# Patient Record
Sex: Female | Born: 1965 | Race: White | Hispanic: No | Marital: Single | Smoking: Former smoker
Health system: Southern US, Community
[De-identification: ages and names within clinical notes are randomized; demographics above are authoritative.]

## PROBLEM LIST (undated history)

## (undated) DIAGNOSIS — E119 Type 2 diabetes mellitus without complications: Secondary | ICD-10-CM

## (undated) HISTORY — DX: Type 2 diabetes mellitus without complications: E11.9

## (undated) HISTORY — PX: ABDOMINAL HYSTERECTOMY: SHX81

---

## 2006-03-12 ENCOUNTER — Ambulatory Visit: Payer: Self-pay | Admitting: Obstetrics and Gynecology

## 2008-04-13 ENCOUNTER — Ambulatory Visit: Payer: Self-pay | Admitting: Obstetrics and Gynecology

## 2009-04-15 ENCOUNTER — Ambulatory Visit: Payer: Self-pay | Admitting: Obstetrics and Gynecology

## 2010-04-17 ENCOUNTER — Ambulatory Visit: Payer: Self-pay | Admitting: Obstetrics and Gynecology

## 2011-06-01 ENCOUNTER — Ambulatory Visit: Payer: Self-pay | Admitting: Obstetrics and Gynecology

## 2012-06-04 ENCOUNTER — Ambulatory Visit: Payer: Self-pay | Admitting: Obstetrics and Gynecology

## 2013-06-05 ENCOUNTER — Ambulatory Visit: Payer: Self-pay | Admitting: Obstetrics and Gynecology

## 2014-01-13 DIAGNOSIS — G4700 Insomnia, unspecified: Secondary | ICD-10-CM | POA: Insufficient documentation

## 2014-01-13 DIAGNOSIS — Z8742 Personal history of other diseases of the female genital tract: Secondary | ICD-10-CM | POA: Insufficient documentation

## 2014-06-25 ENCOUNTER — Ambulatory Visit: Payer: Self-pay | Admitting: Obstetrics and Gynecology

## 2014-09-09 ENCOUNTER — Ambulatory Visit: Payer: Self-pay | Admitting: Obstetrics and Gynecology

## 2014-09-09 LAB — BASIC METABOLIC PANEL
Anion Gap: 9 (ref 7–16)
BUN: 7 mg/dL (ref 7–18)
CHLORIDE: 106 mmol/L (ref 98–107)
CO2: 25 mmol/L (ref 21–32)
Calcium, Total: 8.1 mg/dL — ABNORMAL LOW (ref 8.5–10.1)
Creatinine: 0.86 mg/dL (ref 0.60–1.30)
EGFR (African American): >60
Glucose: 172 mg/dL — ABNORMAL HIGH (ref 65–99)
OSMOLALITY: 281 (ref 275–301)
POTASSIUM: 4 mmol/L (ref 3.5–5.1)
SODIUM: 140 mmol/L (ref 136–145)

## 2014-09-09 LAB — HEMOGLOBIN: HGB: 9.6 g/dL — ABNORMAL LOW (ref 12.0–16.0)

## 2014-09-17 ENCOUNTER — Ambulatory Visit: Payer: Self-pay | Admitting: Obstetrics and Gynecology

## 2014-09-18 LAB — BASIC METABOLIC PANEL
ANION GAP: 10 (ref 7–16)
BUN: 7 mg/dL (ref 7–18)
CO2: 25 mmol/L (ref 21–32)
CREATININE: 0.99 mg/dL (ref 0.60–1.30)
Calcium, Total: 7.4 mg/dL — ABNORMAL LOW (ref 8.5–10.1)
Chloride: 103 mmol/L (ref 98–107)
EGFR (African American): >60
Glucose: 91 mg/dL (ref 65–99)
Osmolality: 273 (ref 275–301)
Potassium: 3.9 mmol/L (ref 3.5–5.1)
Sodium: 138 mmol/L (ref 136–145)

## 2014-09-18 LAB — HEMATOCRIT: HCT: 32.2 % — AB (ref 35.0–47.0)

## 2015-02-26 NOTE — Op Note (Signed)
PATIENT NAME:  Tiffany MainsRAY, Zoria A MR#:  [id number] DATE OF BIRTH:  [birth year]  DATE OF PROCEDURE:  09/17/2014  PREOPERATIVE DIAGNOSES:   1.  Persistent cervical dysplasia.   2.  Abnormal uterine bleeding.    POSTOPERATIVE DIAGNOSES:   1.  Persistent cervical dysplasia.   2.  Abnormal uterine bleeding.   3.  Uterine fibroids.    PROCEDURES PERFORMED:   1.  Transvaginal hysterectomy.   2.  Bilateral salpingectomy.    SURGEON:  Jolinda Croakavid Goodman, MD.    ASSISTANT:  Christeen DouglasBethany Beasley, MD.    ANESTHESIA:  General endotracheal anesthesia.    INTRAVENOUS FLUIDS:  1300 mL lactated Ringer.    ESTIMATED BLOOD LOSS:  50 mL.    URINE OUTPUT:  200 mL clear throughout the course of the case.    COMPLICATIONS:  None.    INDICATIONS:  The patient had persistent low grade dysplasia on multiple Pap smears and colposcopy showed only low grade on multiple evaluations over the last several years.  She was given the option of IUD to address her abnormal uterine bleeding and LEEP procedure to help reduce her risk of cervical cancer, versus having a hysterectomy and she elected to have hysterectomy.    FINDINGS:  Overall benign-appearing cervix and uterus. Approximately 8 cm uterus with three 2 cm fibroids at the fundus.    PROCEDURE:  After obtaining appropriate consent the patient was taken to the operating room where she was placed under general anesthesia. She was prepped and draped in the normal sterile fashion in the dorsal supine position in candy cane stirrups.  A timeout was taken to insure the correct patient and the correct procedure, that her allergies were identified, and that prophylactic antibiotics had been given.  Ancef 2 grams were given at 7:28.   A Foley catheter was inserted and the bladder was drained.  Next a weighed speculum was used along with a small Deaver to visualize the cervix.  The cervix was grasped with a Christella HartiganJacobs tenaculum.  The cervicovaginal junction was infiltrated using 20 mL of  dilute vasopressin (20 units in 30 mL). Next the cervicovaginal junction was circumscribed using Bovie electrosurgery.  Next the posterior aspect of the uterus was palpated and the posterior peritoneum was entered sharply using scissors.  The Heaney clamp was used for all pedicles.  0 Vicryl was used unless otherwise stated.  Larger pedicles were transfixed.  The procedure was performed exactly the same on the left and the right.  The uterosacral ligament was then identified and transected.  This was clamped, cut, tied, and held with a stay suture.  Next a long weighted speculum was placed and adequate visualization of the posterior cul-de-sac was obtained.  The attention was then turned to the anterior portion of the procedure.  The cervicovaginal junction was further dissected sharply using Metzenbaum scissors.  The clear peritoneal reflection could not be identified, so another bite was taken posteriorly.  The cardinal ligament was then clamped, cut, and tied and adequate hemostasis was noted.  Next attention was turned anteriorly again and sharp dissection yielded sufficient view of the peritoneal reflection.  Anterior peritoneum was entered sharply and the large Deaver retractor was placed atraumatically.  This was confirmed to be anterior peritoneum visually.  Next the uterine artery was clamped, cut, and tied and adequate hemostasis was noted.  The broad ligament was then further dissected with approximate 2 clamp, cut, tie sequences.  At this point it was noted that there was a significant  volume in the fundus.  The uterus was morcellated vaginally using a scalpel.  This was done in a coring fashion with all specimens directly removed under dissection and traction without any evidence of specimen being released into the patient's peritoneal cavity.  There were 3 approximately 2 cm fibroids identified at the uterus that were removed and saved for pathology.  Next the uteroovarians could be identified.  These  were clamped, cut, and tied in traditional fashion.  They were doubly ligated first with a transfixing suture and then with a free tie.  The uterus and cervix were then removed and adequate hemostasis was noted.  The fallopian tubes were then visually identified, crossed with Heaney clamp, excised, and tied with 0-Vicryl.  Next long wet tape sponge was placed for adequate visualization.  There was 1 area on the right inferior aspect between the uterosacral and ovarian pedicle that was made hemostatic using a figure-of-eight suture.    The anterior peritoneum was then closed using 2-0 Vicryl in a pursestring fashion.  A McCall uterosacral fixation was completed using 0 Vicryl.  This was accomplished with the needle passing through the posterior vaginal cuff and then posterior peritoneum including the left uterosacral ligament, posterior peritoneum, and then right uterosacral ligament and then exiting the posterior peritoneum and vaginal cuff.  The McCall suture was tied down and adequate apical support was noted.  The vaginal cuff was then closed using 4 figure-of-eight sutures of 0 Vicryl.  Excellent hemostasis was noted, but there had been significant bleeding from the posterior cuff so a vaginal packing was placed prophylactically.  All instruments and sponges were removed from the patient.  Sponge, lap and needle counts were correct x 2.  The patient was awoken from anesthesia without complication.  She was transferred to the floor in stable condition.     ____________________________ Teena Irani. Derrill Kay, MD :bu D: 09/17/2014 17:38:53 ET T: 09/17/2014 21:00:21 ET JOB#: [id number]  cc: Teena Irani. Derrill Kay, MD, <Dictator> DAVID Farrel Conners MD ELECTRONICALLY SIGNED 09/23/2014 7:13

## 2015-02-28 LAB — SURGICAL PATHOLOGY

## 2015-06-10 ENCOUNTER — Other Ambulatory Visit: Payer: Self-pay | Admitting: Obstetrics and Gynecology

## 2015-06-10 DIAGNOSIS — Z1231 Encounter for screening mammogram for malignant neoplasm of breast: Secondary | ICD-10-CM

## 2015-06-28 ENCOUNTER — Ambulatory Visit
Admission: RE | Admit: 2015-06-28 | Discharge: 2015-06-28 | Disposition: A | Discharge status: Home / Self Care | Payer: 59 | Source: Ambulatory Visit | Attending: Obstetrics and Gynecology | Admitting: Obstetrics and Gynecology

## 2015-06-28 DIAGNOSIS — Z1231 Encounter for screening mammogram for malignant neoplasm of breast: Secondary | ICD-10-CM | POA: Insufficient documentation

## 2017-05-03 ENCOUNTER — Other Ambulatory Visit: Payer: Self-pay | Admitting: Student

## 2017-05-03 DIAGNOSIS — Z1239 Encounter for other screening for malignant neoplasm of breast: Secondary | ICD-10-CM

## 2018-05-21 ENCOUNTER — Other Ambulatory Visit: Payer: Self-pay | Admitting: Student

## 2018-05-21 DIAGNOSIS — Z1231 Encounter for screening mammogram for malignant neoplasm of breast: Secondary | ICD-10-CM

## 2018-10-20 ENCOUNTER — Ambulatory Visit
Admission: RE | Admit: 2018-10-20 | Discharge: 2018-10-20 | Disposition: A | Discharge status: Home / Self Care | Payer: BLUE CROSS/BLUE SHIELD | Source: Ambulatory Visit | Attending: Student | Admitting: Student

## 2018-10-20 DIAGNOSIS — Z1231 Encounter for screening mammogram for malignant neoplasm of breast: Secondary | ICD-10-CM | POA: Insufficient documentation

## 2019-06-09 DIAGNOSIS — F411 Generalized anxiety disorder: Secondary | ICD-10-CM | POA: Insufficient documentation

## 2019-06-09 DIAGNOSIS — E1169 Type 2 diabetes mellitus with other specified complication: Secondary | ICD-10-CM | POA: Insufficient documentation

## 2019-11-20 ENCOUNTER — Other Ambulatory Visit: Payer: Self-pay | Admitting: Student

## 2019-11-20 DIAGNOSIS — Z1231 Encounter for screening mammogram for malignant neoplasm of breast: Secondary | ICD-10-CM

## 2019-11-23 ENCOUNTER — Ambulatory Visit
Admission: RE | Admit: 2019-11-23 | Discharge: 2019-11-23 | Disposition: A | Discharge status: Home / Self Care | Payer: BC Managed Care – PPO | Source: Ambulatory Visit | Attending: Student | Admitting: Student

## 2019-11-23 DIAGNOSIS — Z1231 Encounter for screening mammogram for malignant neoplasm of breast: Secondary | ICD-10-CM | POA: Insufficient documentation

## 2021-03-14 ENCOUNTER — Other Ambulatory Visit: Payer: Self-pay | Admitting: Student

## 2021-03-14 DIAGNOSIS — Z1231 Encounter for screening mammogram for malignant neoplasm of breast: Secondary | ICD-10-CM

## 2021-03-16 ENCOUNTER — Ambulatory Visit
Admission: RE | Admit: 2021-03-16 | Discharge: 2021-03-16 | Disposition: A | Discharge status: Home / Self Care | Payer: BC Managed Care – PPO | Source: Ambulatory Visit | Attending: Student | Admitting: Student

## 2021-03-16 ENCOUNTER — Other Ambulatory Visit: Payer: Self-pay

## 2021-03-16 DIAGNOSIS — Z1231 Encounter for screening mammogram for malignant neoplasm of breast: Secondary | ICD-10-CM | POA: Diagnosis not present

## 2021-07-04 DIAGNOSIS — M545 Low back pain, unspecified: Secondary | ICD-10-CM | POA: Insufficient documentation

## 2021-10-07 IMAGING — MG MM DIGITAL SCREENING BILAT W/ TOMO AND CAD
8 series · 9 of 24 positions shown · non-contrast
Comparison: Previous exam(s).

CLINICAL DATA: Screening.

EXAM:
DIGITAL SCREENING BILATERAL MAMMOGRAM WITH TOMOSYNTHESIS AND CAD
TECHNIQUE: Bilateral screening digital craniocaudal and mediolateral oblique
mammograms were obtained. Bilateral screening digital breast
tomosynthesis was performed. The images were evaluated with
computer-aided detection.

[L MLO synth-2D]
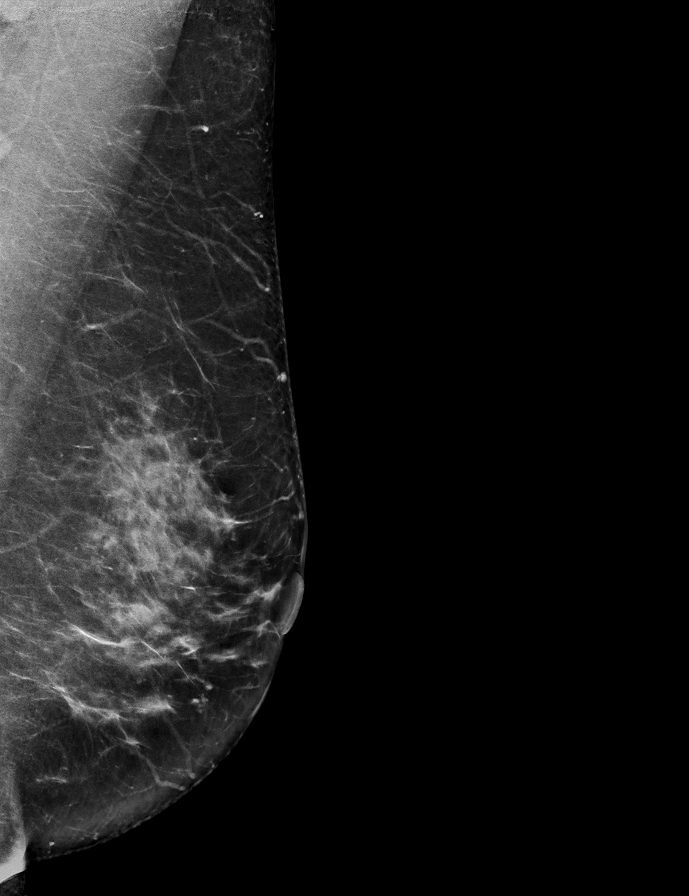

[L CC synth-2D]
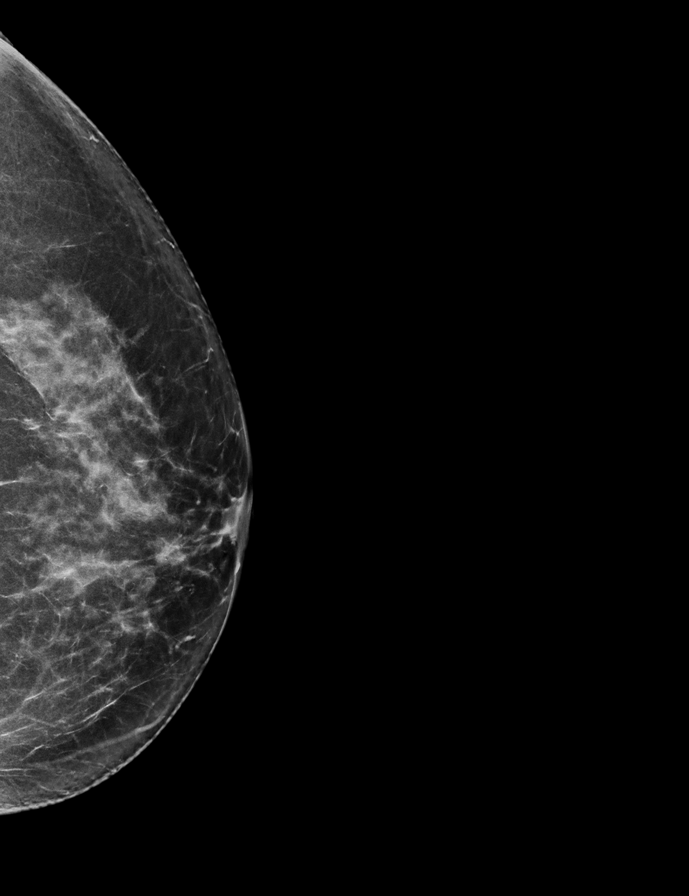

[R CC synth-2D]
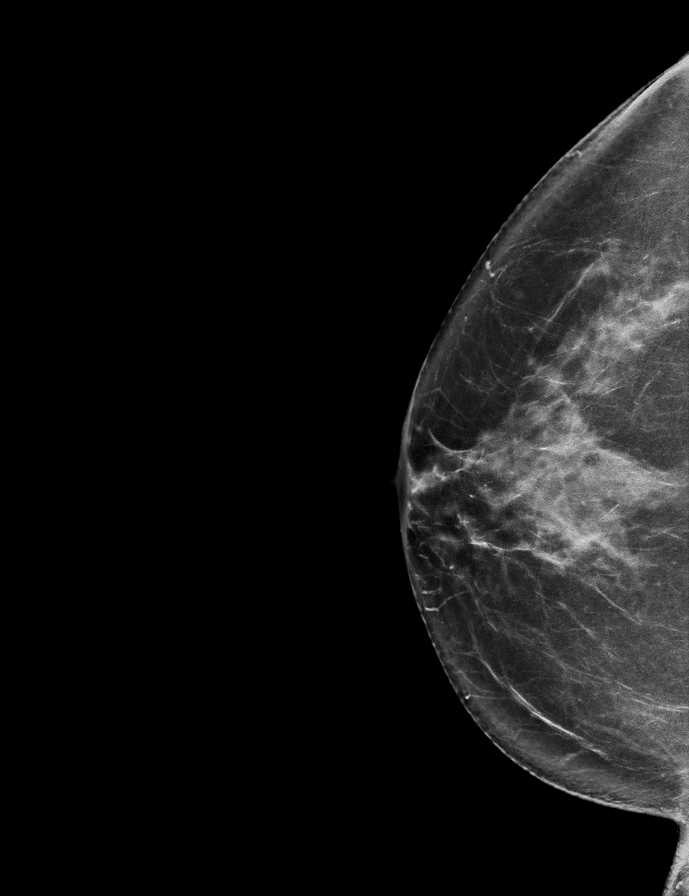

[R MLO synth-2D]
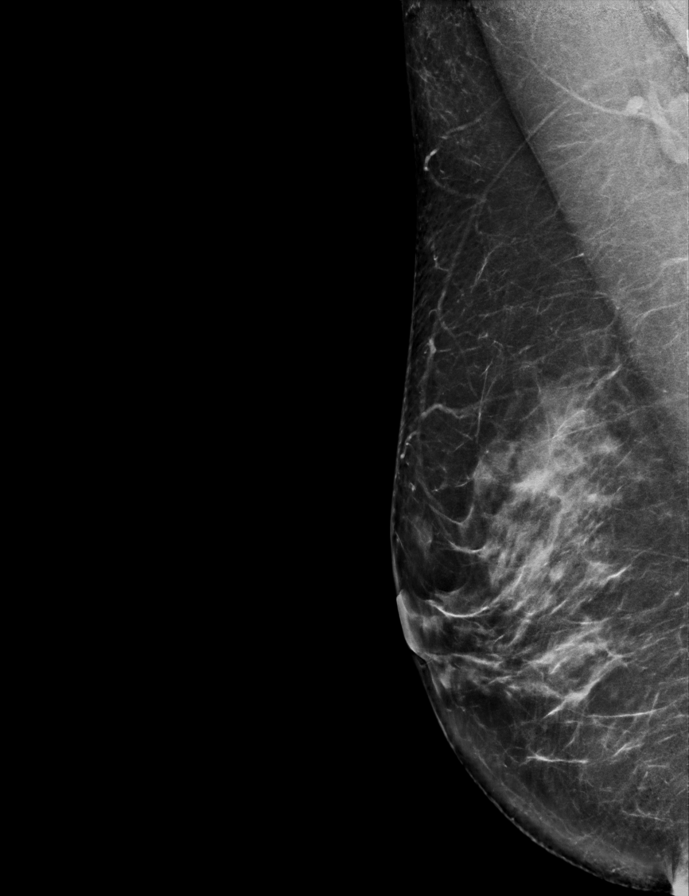

[L MLO tomo · 2 of 75 frames shown]
[frame 25/75]
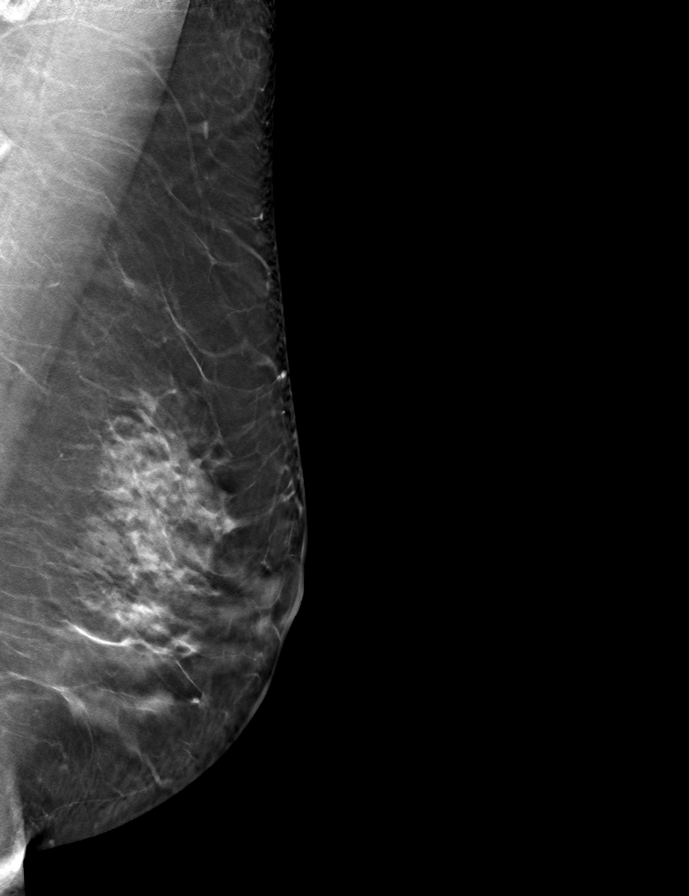
[frame 38/75]
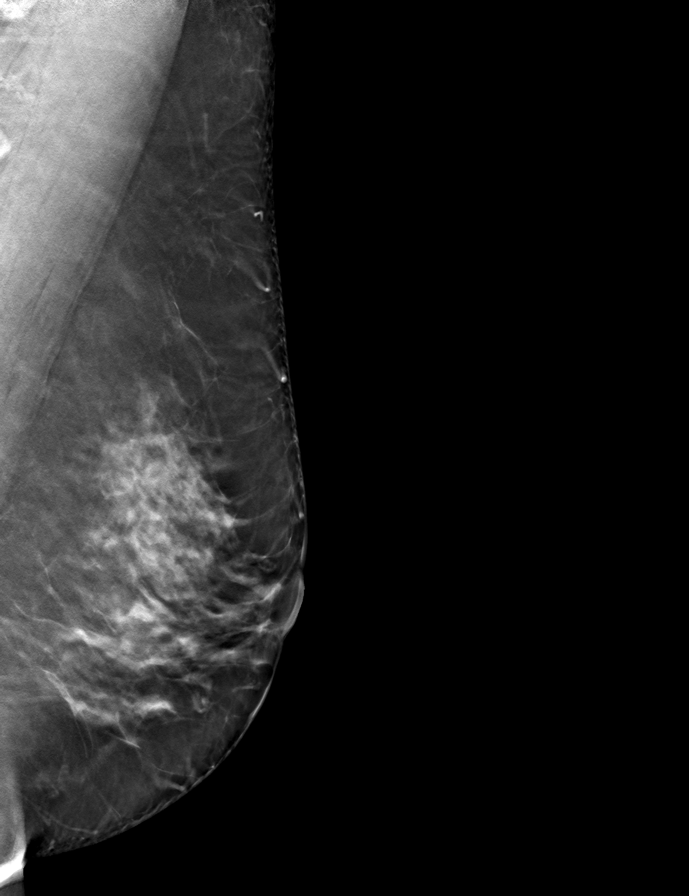

[R CC tomo · tomo slice 39/76.0]
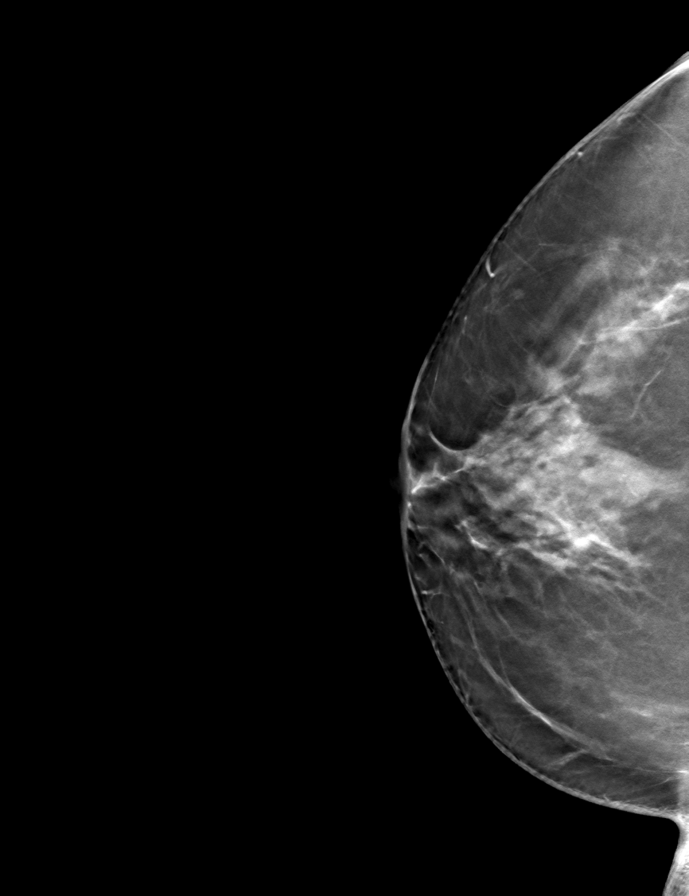

[R MLO tomo · tomo slice 40/79.0]
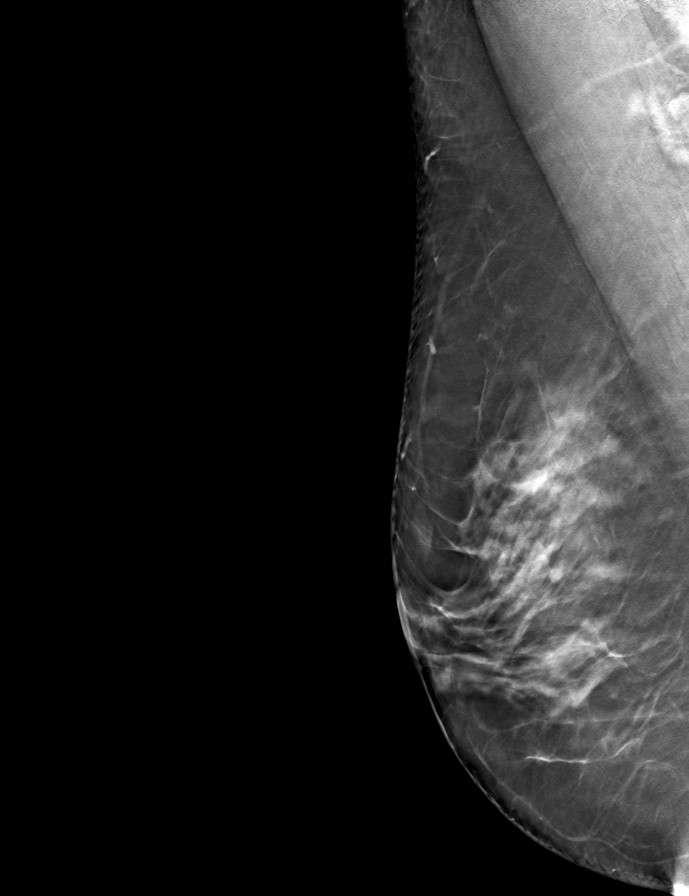

[L CC tomo · tomo slice 33/66.0]
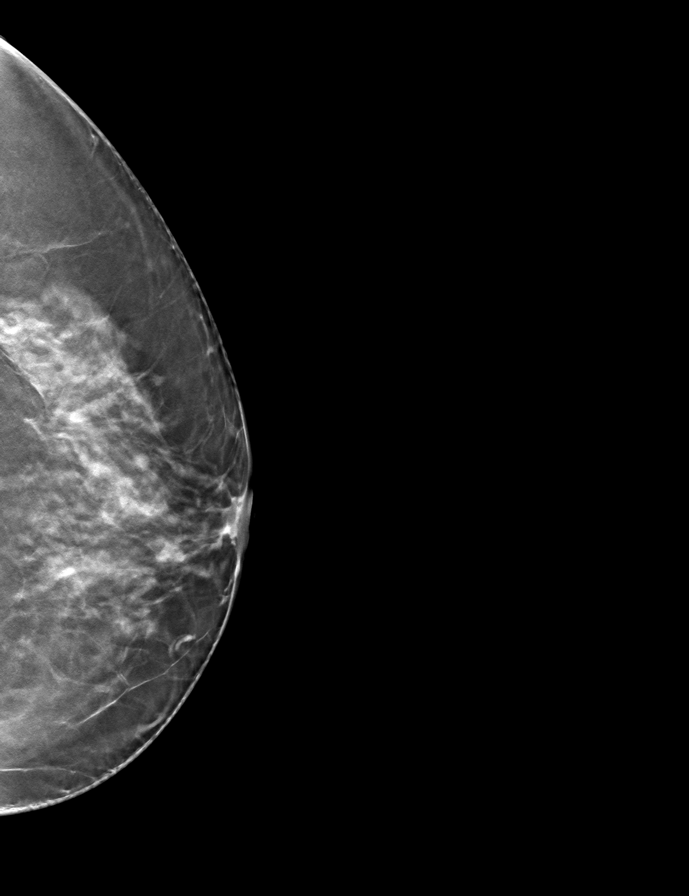

[9 of 24 positions shown; findings below may reference images not displayed]

ACR Breast Density Category c: The breast tissue is heterogeneously
dense, which may obscure small masses.
FINDINGS: There are no findings suspicious for malignancy. The images were
evaluated with computer-aided detection.
IMPRESSION: No mammographic evidence of malignancy. A result letter of this
screening mammogram will be mailed directly to the patient.

RECOMMENDATION:
Screening mammogram in one year. (Code:T4-5-GWO)

BI-RADS CATEGORY  1: Negative.

## 2022-05-02 ENCOUNTER — Other Ambulatory Visit: Payer: Self-pay | Admitting: Student

## 2022-05-02 DIAGNOSIS — Z1231 Encounter for screening mammogram for malignant neoplasm of breast: Secondary | ICD-10-CM

## 2022-05-28 ENCOUNTER — Ambulatory Visit
Admission: RE | Admit: 2022-05-28 | Discharge: 2022-05-28 | Disposition: A | Discharge status: Home / Self Care | Payer: BC Managed Care – PPO | Source: Ambulatory Visit | Attending: Student | Admitting: Student

## 2022-05-28 DIAGNOSIS — Z1231 Encounter for screening mammogram for malignant neoplasm of breast: Secondary | ICD-10-CM

## 2022-06-28 ENCOUNTER — Emergency Department
Admission: EM | Admit: 2022-06-28 | Discharge: 2022-06-28 | Disposition: A | Discharge status: Home / Self Care | Payer: BC Managed Care – PPO | Attending: Emergency Medicine | Admitting: Emergency Medicine

## 2022-06-28 ENCOUNTER — Other Ambulatory Visit: Payer: Self-pay

## 2022-06-28 ENCOUNTER — Emergency Department: Payer: BC Managed Care – PPO

## 2022-06-28 DIAGNOSIS — S62308A Unspecified fracture of other metacarpal bone, initial encounter for closed fracture: Secondary | ICD-10-CM

## 2022-06-28 DIAGNOSIS — S4991XA Unspecified injury of right shoulder and upper arm, initial encounter: Secondary | ICD-10-CM | POA: Diagnosis present

## 2022-06-28 DIAGNOSIS — W1839XA Other fall on same level, initial encounter: Secondary | ICD-10-CM | POA: Diagnosis not present

## 2022-06-28 DIAGNOSIS — W19XXXA Unspecified fall, initial encounter: Secondary | ICD-10-CM

## 2022-06-28 DIAGNOSIS — S62304A Unspecified fracture of fourth metacarpal bone, right hand, initial encounter for closed fracture: Secondary | ICD-10-CM | POA: Diagnosis not present

## 2022-06-28 DIAGNOSIS — S42295A Other nondisplaced fracture of upper end of left humerus, initial encounter for closed fracture: Secondary | ICD-10-CM | POA: Insufficient documentation

## 2022-06-28 MED ORDER — OXYCODONE HCL 5 MG PO TABS
5.0000 mg | ORAL_TABLET | Freq: Once | ORAL | Status: AC
  Administered 2022-06-28: 5 mg via ORAL
  Filled 2022-06-28: qty 1

## 2022-06-28 NOTE — ED Provider Notes (Signed)
[facility] Provider Note    Event Date/Time   First MD Initiated Contact with Patient 06/28/22 [id number]     (approximate)   History   Fall   HPI  Tiffany Burnett is a 56 y.o. female who presents today for evaluation of arm pain.  Patient reports that a bird flew out of her trash can and scared her and she fell, landing on her left arm and her right hand.  She denies head strike or LOC.  She denies any other injuries.  She denies paresthesias or weakness.  She has pain when moving her left shoulder or her right hand.    There are no problems to display for this patient.         Physical Exam   Triage Vital Signs: ED Triage Vitals  Enc Vitals Group     BP 06/28/22 0847 (!) 169/82     Pulse Rate 06/28/22 0847 69     Resp 06/28/22 0847 18     Temp 06/28/22 0847 98 F (36.7 C)     Temp src --      SpO2 06/28/22 0847 97 %     Weight --      Height --      Head Circumference --      Peak Flow --      Pain Score 06/28/22 0840 6     Pain Loc --      Pain Edu? --      Excl. in GC? --     Most recent vital signs: Vitals:   06/28/22 0847  BP: (!) 169/82  Pulse: 69  Resp: 18  Temp: 98 F (36.7 C)  SpO2: 97%    Physical Exam Vitals and nursing note reviewed.  Constitutional:      General: Awake and alert. No acute distress.    Appearance: Normal appearance. The patient is normal weight.  HENT:     Head: Normocephalic and atraumatic.     Mouth: Mucous membranes are moist.  Eyes:     General: PERRL. Normal EOMs        Right eye: No discharge.        Left eye: No discharge.     Conjunctiva/sclera: Conjunctivae normal.  Cardiovascular:     Rate and Rhythm: Normal rate and regular rhythm.     Pulses: Normal pulses.     Heart sounds: Normal heart sounds Pulmonary:     Effort: Pulmonary effort is normal. No respiratory distress.     Breath sounds: Normal breath sounds.  Abdominal:     Abdomen is soft. There is no abdominal tenderness. No  rebound or guarding. No distention. Musculoskeletal:        General: No swelling. Normal range of motion.     Cervical back: Normal range of motion and neck supple.  No midline cervical spine tenderness.  Full range of motion of neck.  Negative Spurling test.  Negative Lhermitte sign.  Normal strength and sensation in bilateral upper extremities. Normal grip strength bilaterally.  Normal intrinsic muscle function of the hand bilaterally.  Normal radial pulses bilaterally. Left upper extremity: No obvious deformity, erythema, ecchymosis, or swelling.  She has tenderness to anterior and lateral shoulder joint line and along the proximal humerus.  No distal humerus tenderness.  No elbow tenderness or forearm/wrist tenderness.  Normal intrinsic muscle function of the hand, normal grip strength.  No open wounds.  Normal radial pulse.  Sensation intact light touch throughout  lower extremity.  No clavicular or AC joint tenderness. Right upper extremity: No obvious wounds, erythema, ecchymosis, or swelling except for very minimal swelling over the lateral aspect of the dorsum of her hand with tenderness to palpation along the fourth metacarpal.  No open wounds.  No malrotation of the fingers.  Sensation intact light touch throughout fingers and arm.  No tenderness along clavicle, AC joint, shoulder, humerus, elbow, forearm, or wrist.  Normal radial pulse. Skin:    General: Skin is warm and dry.     Capillary Refill: Capillary refill takes less than 2 seconds.     Findings: No rash.  Neurological:     Mental Status: The patient is awake and alert.      ED Results / Procedures / Treatments   Labs (all labs ordered are listed, but only abnormal results are displayed) Labs Reviewed - No data to display   EKG     RADIOLOGY I independently reviewed and interpreted imaging and agree with radiologists findings.     PROCEDURES:  Critical Care performed:   Procedures   MEDICATIONS ORDERED IN  ED: Medications  oxyCODONE (Oxy IR/ROXICODONE) immediate release tablet 5 mg (5 mg Oral Given 06/28/22 1011)     IMPRESSION / MDM / ASSESSMENT AND PLAN / ED COURSE  I reviewed the triage vital signs and the nursing notes.   Differential diagnosis includes, but is not limited to, fracture, dislocation, contusion.  Patient is awake and alert, hemodynamically stable and neurovascularly intact.  There was no head trauma, no focal neurological deficits, no cervical spine tenderness, no indication for CT head or neck per [place] criteria.  X-Blaschke of her left wrist demonstrates a nondisplaced proximal humerus fracture, and x-Kapler of her right hand demonstrates a fourth metacarpal fracture.  Her right hand was splinted by RN and her left arm was placed in a sling.  She remained neurovascularly intact both before and after splint placement.  Compartments are soft and compressible throughout, do not suspect compartment syndrome.  No paresthesias.  I gave her the appropriate information for orthopedic follow-up, we discussed the importance of close outpatient follow-up.  Also discussed splint care and return precautions.  Patient understands and agrees with plan.  She was discharged in stable condition.   Patient's presentation is most consistent with acute complicated illness / injury requiring diagnostic workup.      FINAL CLINICAL IMPRESSION(S) / ED DIAGNOSES   Final diagnoses:  Other closed nondisplaced fracture of proximal end of left humerus, initial encounter  Closed displaced fracture of fourth metacarpal bone, initial encounter  Fall, initial encounter     Rx / DC Orders   ED Discharge Orders     None        Note:  This document was prepared using Dragon voice recognition software and may include unintentional dictation errors.   Keturah Shavers 06/28/22 1036    Minna Antis, MD 06/28/22 1145

## 2022-06-28 NOTE — ED Notes (Signed)
See triage note  Presents s/p fall  States she ducked to miss a bird  [place] on left arm    Also having some pain to right hand  Good pulses

## 2022-06-28 NOTE — ED Triage Notes (Addendum)
Pt comes via EMs from home with c/o fall. Pt ducked to avoid bird and fell. Pt states upper left arm pain. Pt does have some swelling per EMs. Pt also has right hand pain. Pt denies any loc or thinners.

## 2022-06-28 NOTE — Discharge Instructions (Signed)
Continue to wear your sling on your left arm and keep your right hand splint clean and dry.  Please follow-up with orthopedics call the phone number provided.  Please return for any new, worsening, or change in symptoms or other concerns.  It was a pleasure caring for you today.

## 2022-08-01 DIAGNOSIS — E119 Type 2 diabetes mellitus without complications: Secondary | ICD-10-CM | POA: Insufficient documentation

## 2024-02-14 ENCOUNTER — Telehealth: Payer: Self-pay | Admitting: Nurse Practitioner

## 2024-02-14 ENCOUNTER — Encounter: Payer: Self-pay | Admitting: Nurse Practitioner

## 2024-02-14 ENCOUNTER — Ambulatory Visit: Payer: Self-pay | Admitting: Nurse Practitioner

## 2024-02-14 VITALS — BP 135/75 | HR 90 | Temp 98.4°F | Resp 16 | Ht 66.0 in | Wt 188.6 lb

## 2024-02-14 DIAGNOSIS — J3089 Other allergic rhinitis: Secondary | ICD-10-CM | POA: Diagnosis not present

## 2024-02-14 DIAGNOSIS — E1169 Type 2 diabetes mellitus with other specified complication: Secondary | ICD-10-CM

## 2024-02-14 DIAGNOSIS — F411 Generalized anxiety disorder: Secondary | ICD-10-CM

## 2024-02-14 DIAGNOSIS — Z1211 Encounter for screening for malignant neoplasm of colon: Secondary | ICD-10-CM

## 2024-02-14 DIAGNOSIS — G4709 Other insomnia: Secondary | ICD-10-CM | POA: Diagnosis not present

## 2024-02-14 DIAGNOSIS — Z1231 Encounter for screening mammogram for malignant neoplasm of breast: Secondary | ICD-10-CM

## 2024-02-14 DIAGNOSIS — Z1212 Encounter for screening for malignant neoplasm of rectum: Secondary | ICD-10-CM

## 2024-02-14 DIAGNOSIS — E785 Hyperlipidemia, unspecified: Secondary | ICD-10-CM

## 2024-02-14 MED ORDER — ACCU-CHEK SOFTCLIX LANCETS MISC: 12 refills | Status: DC

## 2024-02-14 MED ORDER — ZALEPLON 5 MG PO CAPS: 5.0000 mg | ORAL_CAPSULE | Freq: Every evening | ORAL | 2 refills | Status: DC | PRN

## 2024-02-14 MED ORDER — TIRZEPATIDE 5 MG/0.5ML ~~LOC~~ SOAJ: 5.0000 mg | SUBCUTANEOUS | 5 refills | Status: DC

## 2024-02-14 MED ORDER — ACCU-CHEK GUIDE TEST VI STRP: ORAL_STRIP | 12 refills | Status: DC

## 2024-02-14 MED ORDER — ACCU-CHEK GUIDE ME W/DEVICE KIT: PACK | 0 refills | Status: AC

## 2024-02-14 NOTE — Progress Notes (Signed)
 [facility] [address] [place], [place] [address]  Internal MEDICINE  Office Visit Note  Patient Name: Tiffany Burnett  [id number]  [id number]  Date of Service: 02/14/2024   Complaints/HPI Pt is here for establishment of PCP. Chief Complaint  Patient presents with   New Patient (Initial Visit)    Weight loss and get off some  medications     HPI Unknown presents for a new patient visit to establish care.  Well-appearing 58 y.o. female with diabetes, anxiety, insomnia, and high cholesterol  Work: working in [employer] Operating [employer]: live at home alone  Diet: fair  Exercise: walking  Tobacco use: none  Alcohol use: rarely Illicit drug use: none  Routine CRC screening: due now, family history of colon cancer in father Routine mammogram: due now  Pap smear: discontinued, hysterectomy.  Eye exam and/or foot exam: Labs: recent labs done in September.  New or worsening pain:  Insomnia -- used to take [place] but is on mirtazapine now which is causing some weight gain.   Current Medication: Outpatient Encounter Medications as of 02/14/2024  Medication Sig   Accu-Chek Softclix Lancets lancets Use 1 lancet to check glucose once daily and as needed for diabetes   Blood Glucose Monitoring Suppl (ACCU-CHEK GUIDE ME) w/Device KIT Use as directed. E11.65   busPIRone (BUSPAR) 15 MG tablet Take 15 mg by mouth 3 (three) times daily.   cyclobenzaprine (FLEXERIL) 5 MG tablet Take 5 mg by mouth 3 (three) times daily as needed for muscle spasms.   escitalopram (LEXAPRO) 20 MG tablet Take 20 mg by mouth daily.   glucose blood (ACCU-CHEK GUIDE TEST) test strip Use 1 test strip 3 times daily as needed to check glucose for diabetes.   tirzepatide [facility]) 5 MG/0.5ML Pen Inject 5 mg into the skin once a week.   zaleplon (SONATA) 5 MG capsule Take 1 capsule (5 mg total) by mouth at bedtime as needed for sleep.   [DISCONTINUED] metFORMIN (GLUCOPHAGE-XR) 500 MG 24 hr tablet Take 500 mg by mouth  daily with breakfast.   [DISCONTINUED] mirtazapine (REMERON) 30 MG tablet Take 30 mg by mouth at bedtime.   montelukast (SINGULAIR) 10 MG tablet Take 10 mg by mouth at bedtime.   propranolol (INDERAL) 20 MG tablet Take 20 mg by mouth 2 (two) times daily as needed.   simvastatin (ZOCOR) 40 MG tablet Take 40 mg by mouth at bedtime.   No facility-administered encounter medications on file as of 02/14/2024.    Surgical History: History reviewed. No pertinent surgical history.  Medical History: History reviewed. No pertinent past medical history.  Family History: Family History  Problem Relation Age of Onset   Breast cancer Mother 52   COPD Mother    Dementia Father     Social History   Socioeconomic History   Marital status: Single    Spouse name: Not on file   Number of children: Not on file   Years of education: Not on file   Highest education level: Not on file  Occupational History   Not on file  Tobacco Use   Smoking status: Former    Types: Cigarettes   Smokeless tobacco: Never  Substance and Sexual Activity   Alcohol use: Not Currently   Drug use: Never   Sexual activity: Not Currently  Other Topics Concern   Not on file  Social History Narrative   Not on file   Social Drivers of Health   Financial Resource Strain: Low Risk  (  08/05/2023)   Received from [facility]   Overall Financial Resource Strain (CARDIA)    Difficulty of Paying Living Expenses: Not hard at all  Food Insecurity: No Food Insecurity (08/05/2023)   Received from [facility]   Hunger Vital Sign    Worried About Running Out of Food in the Last Year: Never true    Ran Out of Food in the Last Year: Never true  Transportation Needs: No Transportation Needs (08/05/2023)   Received from [facility] - Transportation    In the past 12 months, has lack of transportation kept you from medical appointments or from getting medications?: No     Lack of Transportation (Non-Medical): No  Physical Activity: Not on file  Stress: Not on file  Social Connections: Not on file  Intimate Partner Violence: Not on file     Review of Systems  Constitutional:  Negative for chills, fatigue and unexpected weight change.  HENT:  Positive for postnasal drip, rhinorrhea and sneezing. Negative for congestion and sore throat.   Eyes:  Negative for redness.  Respiratory: Negative.  Negative for cough, chest tightness, shortness of breath and wheezing.   Cardiovascular: Negative.  Negative for chest pain and palpitations.  Gastrointestinal: Negative.  Negative for abdominal pain, constipation, diarrhea, nausea and vomiting.  Genitourinary:  Negative for dysuria and frequency.  Musculoskeletal:  Negative for arthralgias, back pain, joint swelling and neck pain.  Skin:  Negative for rash.  Neurological: Negative.  Negative for tremors and numbness.  Hematological:  Negative for adenopathy. Does not bruise/bleed easily.  Psychiatric/Behavioral:  Positive for sleep disturbance. Negative for behavioral problems (Depression), self-injury and suicidal ideas. The patient is nervous/anxious.     Vital Signs: BP 135/75 Comment: 142/96  Pulse 90   Temp 98.4 F (36.9 C)   Resp 16   Ht 5\' 6"  (1.676 m)   Wt 188 lb 9.6 oz (85.5 kg)   SpO2 94%   BMI 30.44 kg/m    Physical Exam Vitals reviewed.  Constitutional:      General: She is not in acute distress.    Appearance: Normal appearance. She is obese. She is not ill-appearing.  HENT:     Head: Normocephalic and atraumatic.  Eyes:     Pupils: Pupils are equal, round, and reactive to light.  Cardiovascular:     Rate and Rhythm: Normal rate and regular rhythm.  Pulmonary:     Effort: Pulmonary effort is normal. No respiratory distress.  Neurological:     Mental Status: She is alert and oriented to person, place, and time.  Psychiatric:        Mood and Affect: Mood normal.        Behavior:  Behavior normal.       Assessment/Plan: 1. Type 2 diabetes mellitus with other specified complication, without long-term current use of insulin (HCC) (Primary) Will try mounjaro as prescribed. Sample given to patient.  Metformin discontinued. Acu-chek meter and supplies ordered. - Blood Glucose Monitoring Suppl (ACCU-CHEK GUIDE ME) w/Device KIT; Use as directed. E11.65  Dispense: 1 kit; Refill: 0 - Accu-Chek Softclix Lancets lancets; Use 1 lancet to check glucose once daily and as needed for diabetes  Dispense: 100 each; Refill: 12 - glucose blood (ACCU-CHEK GUIDE TEST) test strip; Use 1 test strip 3 times daily as needed to check glucose for diabetes.  Dispense: 100 each; Refill: 12 - tirzepatide (MOUNJARO) 5 MG/0.5ML Pen; Inject 5 mg into the skin  once a week.  Dispense: 2 mL; Refill: 5  2. Hyperlipidemia associated with type 2 diabetes mellitus (HCC) Continue simvastatin as prescribed.  - simvastatin (ZOCOR) 40 MG tablet; Take 40 mg by mouth at bedtime.  3. Non-seasonal allergic rhinitis due to other allergic trigger Continue singulair as prescribed - montelukast (SINGULAIR) 10 MG tablet; Take 10 mg by mouth at bedtime.  4. Other insomnia Discontinue mirtazapine. Start zaleplon as prescribed.  - zaleplon (SONATA) 5 MG capsule; Take 1 capsule (5 mg total) by mouth at bedtime as needed for sleep.  Dispense: 30 capsule; Refill: 2  5. Screening for colorectal cancer Referred to GI - Ambulatory referral to Gastroenterology  6. Encounter for screening mammogram for malignant neoplasm of breast Routine mammogram ordered  - MM 3D SCREENING MAMMOGRAM BILATERAL BREAST; Future  7. Generalized anxiety disorder Continue prn propranolol as prescribed.  - propranolol (INDERAL) 20 MG tablet; Take 20 mg by mouth 2 (two) times daily as needed.    General Counseling: linell meldrum understanding of the findings of todays visit and agrees with plan of treatment. I have discussed any further  diagnostic evaluation that may be needed or ordered today. We also reviewed her medications today. she has been encouraged to call the office with any questions or concerns that should arise related to todays visit.    Orders Placed This Encounter  Procedures   MM 3D SCREENING MAMMOGRAM BILATERAL BREAST   Ambulatory referral to Gastroenterology    Meds ordered this encounter  Medications   Blood Glucose Monitoring Suppl (ACCU-CHEK GUIDE ME) w/Device KIT    Sig: Use as directed. E11.65    Dispense:  1 kit    Refill:  0   Accu-Chek Softclix Lancets lancets    Sig: Use 1 lancet to check glucose once daily and as needed for diabetes    Dispense:  100 each    Refill:  12    Dx code E11.65   glucose blood (ACCU-CHEK GUIDE TEST) test strip    Sig: Use 1 test strip 3 times daily as needed to check glucose for diabetes.    Dispense:  100 each    Refill:  12    Dx code E11.65   zaleplon (SONATA) 5 MG capsule    Sig: Take 1 capsule (5 mg total) by mouth at bedtime as needed for sleep.    Dispense:  30 capsule    Refill:  2    Fill new script today, discontinue mirtazapine now.   tirzepatide (MOUNJARO) 5 MG/0.5ML Pen    Sig: Inject 5 mg into the skin once a week.    Dispense:  2 mL    Refill:  5    Dx code E11.65    Return in about 4 weeks (around 03/13/2024) for F/U, eval new med, Labib Cwynar PCP zaleplon and mounjaro .  Time spent:30 Minutes Time spent with patient included reviewing progress notes, labs, imaging studies, and discussing plan for follow up.   [place] Controlled Substance Database was reviewed by me for overdose risk score (ORS)   This patient was seen by Sallyanne Kuster, FNP-C in collaboration with Dr. Beverely Risen as a part of collaborative care agreement.   Kemya Shed R. Tedd Sias, MSN, FNP-C Internal Medicine

## 2024-02-14 NOTE — Telephone Encounter (Signed)
 Notified patient of mammo appointment date, arrival time, location -Sheralyn Boatman

## 2024-02-18 ENCOUNTER — Telehealth: Payer: Self-pay

## 2024-02-18 ENCOUNTER — Other Ambulatory Visit: Payer: Self-pay

## 2024-02-18 DIAGNOSIS — Z800 Family history of malignant neoplasm of digestive organs: Secondary | ICD-10-CM

## 2024-02-18 DIAGNOSIS — Z1211 Encounter for screening for malignant neoplasm of colon: Secondary | ICD-10-CM

## 2024-02-18 MED ORDER — NA SULFATE-K SULFATE-MG SULF 17.5-3.13-1.6 GM/177ML PO SOLN: 1.0000 | Freq: Once | ORAL | 0 refills | Status: AC

## 2024-02-18 NOTE — Telephone Encounter (Signed)
 Gastroenterology Pre-Procedure Review  Request Date: 03/19/24 Requesting Physician: Dr. Baldomero Bone  PATIENT REVIEW QUESTIONS: The patient responded to the following health history questions as indicated:    1. Are you having any GI issues? no 2. Do you have a personal history of Polyps? no 3. Do you have a family history of Colon Cancer or Polyps? yes (father colon cancer) 4. Diabetes Mellitus?  Pre-diabetic 5. Joint replacements in the past 12 months?no 6. Major health problems in the past 3 months?no 7. Any artificial heart valves, MVP, or defibrillator?no    MEDICATIONS & ALLERGIES:    Patient reports the following regarding taking any anticoagulation/antiplatelet therapy:   Plavix, Coumadin, Eliquis, Xarelto, Lovenox, Pradaxa, Brilinta, or Effient? no Aspirin? no  Patient confirms/reports the following medications:  Current Outpatient Medications  Medication Sig Dispense Refill   Accu-Chek Softclix Lancets lancets Use 1 lancet to check glucose once daily and as needed for diabetes 100 each 12   Blood Glucose Monitoring Suppl (ACCU-CHEK GUIDE ME) w/Device KIT Use as directed. E11.65 1 kit 0   busPIRone (BUSPAR) 15 MG tablet Take 15 mg by mouth 3 (three) times daily.     cyclobenzaprine (FLEXERIL) 5 MG tablet Take 5 mg by mouth 3 (three) times daily as needed for muscle spasms.     escitalopram (LEXAPRO) 20 MG tablet Take 20 mg by mouth daily.     glucose blood (ACCU-CHEK GUIDE TEST) test strip Use 1 test strip 3 times daily as needed to check glucose for diabetes. 100 each 12   montelukast (SINGULAIR) 10 MG tablet Take 10 mg by mouth at bedtime.     propranolol (INDERAL) 20 MG tablet Take 20 mg by mouth 2 (two) times daily as needed.     simvastatin (ZOCOR) 40 MG tablet Take 40 mg by mouth at bedtime.     tirzepatide (MOUNJARO) 5 MG/0.5ML Pen Inject 5 mg into the skin once a week. (Patient not taking: Reported on 02/18/2024) 2 mL 5   zaleplon (SONATA) 5 MG capsule Take 1 capsule (5 mg  total) by mouth at bedtime as needed for sleep. 30 capsule 2   No current facility-administered medications for this visit.    Patient confirms/reports the following allergies:  No Known Allergies  No orders of the defined types were placed in this encounter.   AUTHORIZATION INFORMATION Primary Insurance: 1D#: Group #:  Secondary Insurance: 1D#: Group #:  SCHEDULE INFORMATION: Date: 03/19/24 Time: Location: [facility]

## 2024-02-25 ENCOUNTER — Encounter

## 2024-02-28 ENCOUNTER — Encounter: Payer: Self-pay | Admitting: Nurse Practitioner

## 2024-03-03 ENCOUNTER — Other Ambulatory Visit: Payer: Self-pay | Admitting: Nurse Practitioner

## 2024-03-03 ENCOUNTER — Encounter: Payer: Self-pay | Admitting: Nurse Practitioner

## 2024-03-03 MED ORDER — ZALEPLON 10 MG PO CAPS: 10.0000 mg | ORAL_CAPSULE | Freq: Every evening | ORAL | 0 refills | Status: DC | PRN

## 2024-03-06 ENCOUNTER — Encounter

## 2024-03-12 ENCOUNTER — Encounter (HOSPITAL_COMMUNITY): Payer: Self-pay

## 2024-03-16 ENCOUNTER — Ambulatory Visit: Admitting: Nurse Practitioner

## 2024-03-16 ENCOUNTER — Encounter: Payer: Self-pay | Admitting: Nurse Practitioner

## 2024-03-16 VITALS — BP 130/86 | HR 71 | Temp 98.2°F | Resp 16 | Ht 66.0 in | Wt 177.2 lb

## 2024-03-16 DIAGNOSIS — E1169 Type 2 diabetes mellitus with other specified complication: Secondary | ICD-10-CM | POA: Diagnosis not present

## 2024-03-16 DIAGNOSIS — E663 Overweight: Secondary | ICD-10-CM

## 2024-03-16 DIAGNOSIS — F411 Generalized anxiety disorder: Secondary | ICD-10-CM | POA: Diagnosis not present

## 2024-03-16 DIAGNOSIS — J3089 Other allergic rhinitis: Secondary | ICD-10-CM

## 2024-03-16 DIAGNOSIS — G4709 Other insomnia: Secondary | ICD-10-CM | POA: Diagnosis not present

## 2024-03-16 DIAGNOSIS — E785 Hyperlipidemia, unspecified: Secondary | ICD-10-CM

## 2024-03-16 DIAGNOSIS — Z6828 Body mass index (BMI) 28.0-28.9, adult: Secondary | ICD-10-CM

## 2024-03-16 MED ORDER — MONTELUKAST SODIUM 10 MG PO TABS: 10.0000 mg | ORAL_TABLET | Freq: Every day | ORAL | 3 refills | Status: DC

## 2024-03-16 MED ORDER — ESCITALOPRAM OXALATE 20 MG PO TABS: 20.0000 mg | ORAL_TABLET | Freq: Every day | ORAL | 3 refills | Status: AC

## 2024-03-16 MED ORDER — BUSPIRONE HCL 15 MG PO TABS: 15.0000 mg | ORAL_TABLET | Freq: Three times a day (TID) | ORAL | 3 refills | Status: DC

## 2024-03-16 MED ORDER — SIMVASTATIN 40 MG PO TABS: 40.0000 mg | ORAL_TABLET | Freq: Every day | ORAL | 3 refills | Status: AC

## 2024-03-16 MED ORDER — ZALEPLON 10 MG PO CAPS: 10.0000 mg | ORAL_CAPSULE | Freq: Every evening | ORAL | 2 refills | Status: DC | PRN

## 2024-03-16 NOTE — Progress Notes (Signed)
 [facility] [address] [place], [place] [address]  Internal MEDICINE  Office Visit Note  Patient Name: Tiffany Burnett  [id number]  [id number]  Date of Service: 03/16/2024  Chief Complaint  Patient presents with   Follow-up    HPI Tiffany Burnett presents for a follow-up visit for diabetes, insomnia, and weight loss.  Diabetes -- started mounjaro. She took her first 5 mg dose this week and is tolerating it well. She reports mild nausea but no other significant side effects.  Insomnia -- started on zaleplon  5 mg. It was helping but not quite enough. Tried 2 tabs and this was adequate so a refill of 10 mg tablet was sent to pharmacy before her appt today.  Weight loss -- her BMI was >30 at her previous office visit. She has lost 11 lbs since starting mounjaro and her BMI is now 28.6.     Current Medication: Outpatient Encounter Medications as of 03/16/2024  Medication Sig   Accu-Chek Softclix Lancets lancets Use 1 lancet to check glucose once daily and as needed for diabetes   Blood Glucose Monitoring Suppl (ACCU-CHEK GUIDE ME) w/Device KIT Use as directed. E11.65   glucose blood (ACCU-CHEK GUIDE TEST) test strip Use 1 test strip 3 times daily as needed to check glucose for diabetes.   propranolol (INDERAL) 20 MG tablet Take 20 mg by mouth 2 (two) times daily as needed.   tirzepatide (MOUNJARO) 5 MG/0.5ML Pen Inject 5 mg into the skin once a week.   [DISCONTINUED] busPIRone  (BUSPAR ) 15 MG tablet Take 15 mg by mouth 3 (three) times daily.   [DISCONTINUED] cyclobenzaprine (FLEXERIL) 5 MG tablet Take 5 mg by mouth 3 (three) times daily as needed for muscle spasms.   [DISCONTINUED] escitalopram  (LEXAPRO ) 20 MG tablet Take 20 mg by mouth daily.   [DISCONTINUED] montelukast  (SINGULAIR ) 10 MG tablet Take 10 mg by mouth at bedtime.   [DISCONTINUED] simvastatin  (ZOCOR ) 40 MG tablet Take 40 mg by mouth at bedtime.   [DISCONTINUED] zaleplon  (SONATA ) 10 MG capsule Take 1 capsule (10 mg total) by  mouth at bedtime as needed for sleep.   busPIRone  (BUSPAR ) 15 MG tablet Take 1 tablet (15 mg total) by mouth 3 (three) times daily.   escitalopram  (LEXAPRO ) 20 MG tablet Take 1 tablet (20 mg total) by mouth daily.   montelukast  (SINGULAIR ) 10 MG tablet Take 1 tablet (10 mg total) by mouth at bedtime.   simvastatin  (ZOCOR ) 40 MG tablet Take 1 tablet (40 mg total) by mouth at bedtime.   zaleplon  (SONATA ) 10 MG capsule Take 1 capsule (10 mg total) by mouth at bedtime as needed for sleep.   No facility-administered encounter medications on file as of 03/16/2024.    Surgical History: History reviewed. No pertinent surgical history.  Medical History: History reviewed. No pertinent past medical history.  Family History: Family History  Problem Relation Age of Onset   Breast cancer Mother 66   COPD Mother    Dementia Father     Social History   Socioeconomic History   Marital status: Single    Spouse name: Not on file   Number of children: Not on file   Years of education: Not on file   Highest education level: Not on file  Occupational History   Not on file  Tobacco Use   Smoking status: Former    Types: Cigarettes   Smokeless tobacco: Never  Substance and Sexual Activity   Alcohol use: Not Currently   Drug use: Never  Sexual activity: Not Currently  Other Topics Concern   Not on file  Social History Narrative   Not on file   Social Drivers of Health   Financial Resource Strain: Low Risk  (08/05/2023)   Received from [facility]   Overall Financial Resource Strain (CARDIA)    Difficulty of Paying Living Expenses: Not hard at all  Food Insecurity: No Food Insecurity (08/05/2023)   Received from [facility]   Hunger Vital Sign    Worried About Running Out of Food in the Last Year: Never true    Ran Out of Food in the Last Year: Never true  Transportation Needs: No Transportation Needs (08/05/2023)   Received from [facility] - Transportation    In the past 12 months, has lack of transportation kept you from medical appointments or from getting medications?: No    Lack of Transportation (Non-Medical): No  Physical Activity: Not on file  Stress: Not on file  Social Connections: Not on file  Intimate Partner Violence: Not on file      Review of Systems  Constitutional:  Positive for unexpected weight change. Negative for chills and fatigue.  HENT:  Positive for postnasal drip, rhinorrhea and sneezing. Negative for congestion and sore throat.   Eyes:  Negative for redness.  Respiratory: Negative.  Negative for cough, chest tightness, shortness of breath and wheezing.   Cardiovascular: Negative.  Negative for chest pain and palpitations.  Gastrointestinal:  Positive for nausea. Negative for abdominal pain, constipation, diarrhea and vomiting.  Genitourinary:  Negative for dysuria and frequency.  Musculoskeletal:  Negative for arthralgias, back pain, joint swelling and neck pain.  Skin:  Negative for rash.  Neurological: Negative.  Negative for tremors and numbness.  Hematological:  Negative for adenopathy. Does not bruise/bleed easily.  Psychiatric/Behavioral:  Positive for sleep disturbance. Negative for behavioral problems (Depression), self-injury and suicidal ideas. The patient is nervous/anxious.     Vital Signs: BP 130/86   Pulse 71   Temp 98.2 F (36.8 C)   Resp 16   Ht 5\' 6"  (1.676 m)   Wt 177 lb 3.2 oz (80.4 kg)   SpO2 97%   BMI 28.60 kg/m    Physical Exam Vitals reviewed.  Constitutional:      General: She is not in acute distress.    Appearance: Normal appearance. She is obese. She is not ill-appearing.  HENT:     Head: Normocephalic and atraumatic.  Eyes:     Pupils: Pupils are equal, round, and reactive to light.  Cardiovascular:     Rate and Rhythm: Normal rate and regular rhythm.  Pulmonary:     Effort: Pulmonary effort is normal. No respiratory  distress.  Neurological:     Mental Status: She is alert and oriented to person, place, and time.  Psychiatric:        Mood and Affect: Mood normal.        Behavior: Behavior normal.        Assessment/Plan: 1. Type 2 diabetes mellitus with other specified complication, without long-term current use of insulin (HCC) (Primary) Continue mounjaro as prescribed.   2. Hyperlipidemia associated with type 2 diabetes mellitus (HCC) Continue simvastatin  as prescribed.  - simvastatin  (ZOCOR ) 40 MG tablet; Take 1 tablet (40 mg total) by mouth at bedtime.  Dispense: 90 tablet; Refill: 3  3. Other insomnia Doing well with 10 mg dose of zaleplon , continue as prescribed, follow up in 3  months for additional refills.  - zaleplon  (SONATA ) 10 MG capsule; Take 1 capsule (10 mg total) by mouth at bedtime as needed for sleep.  Dispense: 30 capsule; Refill: 2  4. Non-seasonal allergic rhinitis due to other allergic trigger Continue montelukast  as prescribed.  - montelukast  (SINGULAIR ) 10 MG tablet; Take 1 tablet (10 mg total) by mouth at bedtime.  Dispense: 90 tablet; Refill: 3  5. Generalized anxiety disorder Continue buspirone  and lexapro  as prescribed.  - busPIRone  (BUSPAR ) 15 MG tablet; Take 1 tablet (15 mg total) by mouth 3 (three) times daily.  Dispense: 90 tablet; Refill: 3 - escitalopram  (LEXAPRO ) 20 MG tablet; Take 1 tablet (20 mg total) by mouth daily.  Dispense: 90 tablet; Refill: 3  6. Overweight with body mass index (BMI) of 28 to 28.9 in adult Continue mounjaro for her diabetes which will also continue to help with weight loss. Lost 11 lbs so far.    General Counseling: Tiffany Burnett understanding of the findings of todays visit and agrees with plan of treatment. I have discussed any further diagnostic evaluation that may be needed or ordered today. We also reviewed her medications today. she has been encouraged to call the office with any questions or concerns that should arise related  to todays visit.    No orders of the defined types were placed in this encounter.   Meds ordered this encounter  Medications   zaleplon  (SONATA ) 10 MG capsule    Sig: Take 1 capsule (10 mg total) by mouth at bedtime as needed for sleep.    Dispense:  30 capsule    Refill:  2    For future refills   busPIRone  (BUSPAR ) 15 MG tablet    Sig: Take 1 tablet (15 mg total) by mouth 3 (three) times daily.    Dispense:  90 tablet    Refill:  3   escitalopram  (LEXAPRO ) 20 MG tablet    Sig: Take 1 tablet (20 mg total) by mouth daily.    Dispense:  90 tablet    Refill:  3   montelukast  (SINGULAIR ) 10 MG tablet    Sig: Take 1 tablet (10 mg total) by mouth at bedtime.    Dispense:  90 tablet    Refill:  3   simvastatin  (ZOCOR ) 40 MG tablet    Sig: Take 1 tablet (40 mg total) by mouth at bedtime.    Dispense:  90 tablet    Refill:  3    Return in about 3 months (around 06/10/2024) for F/U, Tiffany Burnett PCP zaleplon  refills and lab orders. .   Total time spent:30 Minutes Time spent includes review of chart, medications, test results, and follow up plan with the patient.   [place] Controlled Substance Database was reviewed by me.  This patient was seen by Laurence Pons, FNP-C in collaboration with Dr. Verneta Gone as a part of collaborative care agreement.   Labarron Durnin R. Bobbi Burow, MSN, FNP-C Internal medicine

## 2024-03-17 ENCOUNTER — Telehealth: Payer: Self-pay | Admitting: *Deleted

## 2024-03-17 ENCOUNTER — Ambulatory Visit
Admission: RE | Admit: 2024-03-17 | Discharge: 2024-03-17 | Disposition: A | Discharge status: Home / Self Care | Source: Ambulatory Visit | Attending: Nurse Practitioner | Admitting: Nurse Practitioner

## 2024-03-17 ENCOUNTER — Other Ambulatory Visit: Payer: Self-pay | Admitting: *Deleted

## 2024-03-17 DIAGNOSIS — Z1231 Encounter for screening mammogram for malignant neoplasm of breast: Secondary | ICD-10-CM | POA: Diagnosis present

## 2024-03-17 DIAGNOSIS — Z1211 Encounter for screening for malignant neoplasm of colon: Secondary | ICD-10-CM

## 2024-03-17 NOTE — Telephone Encounter (Signed)
 Patient called back in afternoon. Inform her of Dr Baldomero Bone is leaving the practice. Patient stated that she is okay with any provider to complete the colonoscopy.  Requesting to reschedule to Monday, 04/20/2024 with Dr Ole Berkeley.  New instructions will be sent for patient. Patient already pick up prep solution.

## 2024-03-17 NOTE — Telephone Encounter (Signed)
 Tiffany Burnett called from the endo unit this morning stating patient left a voicemail because she needs to cancel her procedure on 03/19/2024. I have called and left a voicemail for patient.

## 2024-03-19 ENCOUNTER — Ambulatory Visit: Admission: RE | Admit: 2024-03-19 | Source: Home / Self Care | Admitting: Gastroenterology

## 2024-03-19 SURGERY — COLONOSCOPY
Anesthesia: General

## 2024-03-23 ENCOUNTER — Encounter: Payer: Self-pay | Admitting: Nurse Practitioner

## 2024-04-20 ENCOUNTER — Other Ambulatory Visit: Payer: Self-pay

## 2024-04-20 ENCOUNTER — Ambulatory Visit: Admitting: Certified Registered"

## 2024-04-20 ENCOUNTER — Encounter
Admission: RE | Disposition: A | Discharge status: Home / Self Care | Payer: Self-pay | Source: Home / Self Care | Attending: Gastroenterology

## 2024-04-20 ENCOUNTER — Encounter: Payer: Self-pay | Admitting: Gastroenterology

## 2024-04-20 ENCOUNTER — Ambulatory Visit
Admission: RE | Admit: 2024-04-20 | Discharge: 2024-04-20 | Disposition: A | Discharge status: Home / Self Care | Attending: Gastroenterology | Admitting: Gastroenterology

## 2024-04-20 DIAGNOSIS — K640 First degree hemorrhoids: Secondary | ICD-10-CM | POA: Insufficient documentation

## 2024-04-20 DIAGNOSIS — Z87891 Personal history of nicotine dependence: Secondary | ICD-10-CM | POA: Diagnosis not present

## 2024-04-20 DIAGNOSIS — K635 Polyp of colon: Secondary | ICD-10-CM | POA: Insufficient documentation

## 2024-04-20 DIAGNOSIS — Z1211 Encounter for screening for malignant neoplasm of colon: Secondary | ICD-10-CM

## 2024-04-20 DIAGNOSIS — K5730 Diverticulosis of large intestine without perforation or abscess without bleeding: Secondary | ICD-10-CM | POA: Diagnosis not present

## 2024-04-20 DIAGNOSIS — E119 Type 2 diabetes mellitus without complications: Secondary | ICD-10-CM | POA: Diagnosis not present

## 2024-04-20 DIAGNOSIS — D125 Benign neoplasm of sigmoid colon: Secondary | ICD-10-CM | POA: Insufficient documentation

## 2024-04-20 HISTORY — PX: COLONOSCOPY: SHX5424

## 2024-04-20 HISTORY — PX: POLYPECTOMY: SHX149

## 2024-04-20 LAB — GLUCOSE, CAPILLARY: Glucose-Capillary: 114 mg/dL — ABNORMAL HIGH (ref 70–99)

## 2024-04-20 SURGERY — COLONOSCOPY
Anesthesia: General

## 2024-04-20 MED ORDER — LIDOCAINE HCL (CARDIAC) PF 100 MG/5ML IV SOSY
PREFILLED_SYRINGE | INTRAVENOUS | Status: DC | PRN
  Administered 2024-04-20: 50 mg via INTRAVENOUS

## 2024-04-20 MED ORDER — PROPOFOL 500 MG/50ML IV EMUL
INTRAVENOUS | Status: DC | PRN
  Administered 2024-04-20: 130 ug/kg/min via INTRAVENOUS

## 2024-04-20 MED ORDER — SODIUM CHLORIDE 0.9 % IV SOLN: INTRAVENOUS | Status: DC

## 2024-04-20 MED ORDER — PROPOFOL 1000 MG/100ML IV EMUL
INTRAVENOUS | Status: AC
  Filled 2024-04-20: qty 100

## 2024-04-20 MED ORDER — PROPOFOL 10 MG/ML IV BOLUS
INTRAVENOUS | Status: DC | PRN
Start: 2024-04-20 — End: 2024-04-20
  Administered 2024-04-20: 70 mg via INTRAVENOUS

## 2024-04-20 NOTE — H&P (Signed)
 Tiffany Sink, MD [facility] [address] [place], [place] [address] Phone: [phone] Fax : [phone]  Primary Care Physician:  Laurence Pons, NP Primary Gastroenterologist:  Dr. Ole Berkeley  Pre-Procedure History & Physical: HPI:  Tiffany Burnett is a 58 y.o. female is here for a screening colonoscopy.   History reviewed. No pertinent past medical history.  Past Surgical History:  Procedure Laterality Date   ABDOMINAL HYSTERECTOMY      Prior to Admission medications   Medication Sig Start Date End Date Taking? Authorizing Provider  busPIRone  (BUSPAR ) 15 MG tablet Take 1 tablet (15 mg total) by mouth 3 (three) times daily. 03/16/24  Yes Abernathy, Janeice Medal, NP  escitalopram  (LEXAPRO ) 20 MG tablet Take 1 tablet (20 mg total) by mouth daily. 03/16/24  Yes Abernathy, Janeice Medal, NP  montelukast  (SINGULAIR ) 10 MG tablet Take 1 tablet (10 mg total) by mouth at bedtime. 03/16/24  Yes Abernathy, Janeice Medal, NP  propranolol (INDERAL) 20 MG tablet Take 20 mg by mouth 2 (two) times daily as needed.   Yes [provider]  simvastatin  (ZOCOR ) 40 MG tablet Take 1 tablet (40 mg total) by mouth at bedtime. 03/16/24  Yes Abernathy, Janeice Medal, NP  zaleplon  (SONATA ) 10 MG capsule Take 1 capsule (10 mg total) by mouth at bedtime as needed for sleep. 03/16/24  Yes Laurence Pons, NP  Accu-Chek Softclix Lancets lancets Use 1 lancet to check glucose once daily and as needed for diabetes 02/14/24   Laurence Pons, NP  Blood Glucose Monitoring Suppl (ACCU-CHEK GUIDE ME) w/Device KIT Use as directed. E11.65 02/14/24   Laurence Pons, NP  glucose blood (ACCU-CHEK GUIDE TEST) test strip Use 1 test strip 3 times daily as needed to check glucose for diabetes. 02/14/24   Abernathy, Alyssa, NP  tirzepatide  (MOUNJARO ) 5 MG/0.5ML Pen Inject 5 mg into the skin once a week. 02/14/24   Laurence Pons, NP    Allergies as of 03/17/2024   (No Known Allergies)    Family History  Problem Relation Age of Onset    Breast cancer Mother 61   COPD Mother    Dementia Father     Social History   Socioeconomic History   Marital status: Single    Spouse name: Not on file   Number of children: Not on file   Years of education: Not on file   Highest education level: Not on file  Occupational History   Not on file  Tobacco Use   Smoking status: Former    Types: Cigarettes   Smokeless tobacco: Never  Substance and Sexual Activity   Alcohol use: Not Currently   Drug use: Never   Sexual activity: Not Currently  Other Topics Concern   Not on file  Social History Narrative   Not on file   Social Drivers of Health   Financial Resource Strain: Low Risk  (08/05/2023)   Received from [facility]   Overall Financial Resource Strain (CARDIA)    Difficulty of Paying Living Expenses: Not hard at all  Food Insecurity: No Food Insecurity (08/05/2023)   Received from [facility]   Hunger Vital Sign    Within the past 12 months, you worried that your food would run out before you got the money to buy more.: Never true    Within the past 12 months, the food you bought just didn't last and you didn't have money to get more.: Never true  Transportation Needs: No Transportation Needs (08/05/2023)   Received from  [facility]   PRAPARE - Transportation    In the past 12 months, has lack of transportation kept you from medical appointments or from getting medications?: No    Lack of Transportation (Non-Medical): No  Physical Activity: Not on file  Stress: Not on file  Social Connections: Not on file  Intimate Partner Violence: Not on file    Review of Systems: See HPI, otherwise negative ROS  Physical Exam: BP 135/64   Pulse 76   Temp (!) 96.8 F (36 C)   Wt 71.2 kg   SpO2 97%   BMI 25.34 kg/m  General:   Alert,  pleasant and cooperative in NAD Head:  Normocephalic and atraumatic. Neck:  Supple; no masses or thyromegaly. Lungs:  Clear throughout  to auscultation.    Heart:  Regular rate and rhythm. Abdomen:  Soft, nontender and nondistended. Normal bowel sounds, without guarding, and without rebound.   Neurologic:  Alert and  oriented x4;  grossly normal neurologically.  Impression/Plan: Tiffany Burnett is now here to undergo a screening colonoscopy.  Risks, benefits, and alternatives regarding colonoscopy have been reviewed with the patient.  Questions have been answered.  All parties agreeable.

## 2024-04-20 NOTE — Anesthesia Preprocedure Evaluation (Signed)
 Anesthesia Evaluation  Patient identified by MRN, date of birth, ID band Patient awake    Reviewed: Allergy & Precautions, H&P , NPO status , Patient's Chart, lab work & pertinent test results, reviewed documented beta blocker date and time   History of Anesthesia Complications Negative for: history of anesthetic complications  Airway Mallampati: II  TM Distance: >3 FB Neck ROM: full    Dental  (+) Dental Advidsory Given, Caps, Teeth Intact   Pulmonary neg pulmonary ROS, former smoker   Pulmonary exam normal breath sounds clear to auscultation       Cardiovascular Exercise Tolerance: Good negative cardio ROS Normal cardiovascular exam Rhythm:regular Rate:Normal     Neuro/Psych  PSYCHIATRIC DISORDERS Anxiety     negative neurological ROS     GI/Hepatic negative GI ROS, Neg liver ROS,,,  Endo/Other  diabetes, Well Controlled, Type 2    Renal/GU negative Renal ROS  negative genitourinary   Musculoskeletal   Abdominal   Peds  Hematology negative hematology ROS (+)   Anesthesia Other Findings History reviewed. No pertinent past medical history.   Reproductive/Obstetrics negative OB ROS                             Anesthesia Physical Anesthesia Plan  ASA: 2  Anesthesia Plan: General   Post-op Pain Management:    Induction: Intravenous  PONV Risk Score and Plan: 3 and Propofol infusion, TIVA and Treatment may vary due to age or medical condition  Airway Management Planned: Natural Airway and Nasal Cannula  Additional Equipment:   Intra-op Plan:   Post-operative Plan:   Informed Consent: I have reviewed the patients History and Physical, chart, labs and discussed the procedure including the risks, benefits and alternatives for the proposed anesthesia with the patient or authorized representative who has indicated his/her understanding and acceptance.     Dental Advisory  Given  Plan Discussed with: Anesthesiologist, CRNA and Surgeon  Anesthesia Plan Comments:        Anesthesia Quick Evaluation

## 2024-04-20 NOTE — Anesthesia Postprocedure Evaluation (Signed)
 Anesthesia Post Note  Patient: Tiffany Burnett  Procedure(s) Performed: COLONOSCOPY POLYPECTOMY, INTESTINE  Patient location during evaluation: Endoscopy Anesthesia Type: General Level of consciousness: awake and alert Pain management: pain level controlled Vital Signs Assessment: post-procedure vital signs reviewed and stable Respiratory status: spontaneous breathing, nonlabored ventilation, respiratory function stable and patient connected to nasal cannula oxygen Cardiovascular status: blood pressure returned to baseline and stable Postop Assessment: no apparent nausea or vomiting Anesthetic complications: no   No notable events documented.   Last Vitals:  Vitals:   04/20/24 0800 04/20/24 0804  BP: 102/61 105/67  Pulse: 77 72  Resp: 19 11  Temp:  (!) 36.3 C  SpO2: 100% 100%    Last Pain:  Vitals:   04/20/24 0804  PainSc: 0-No pain                 Vanice Genre

## 2024-04-20 NOTE — Transfer of Care (Signed)
 Immediate Anesthesia Transfer of Care Note  Patient: Tiffany Burnett  Procedure(s) Performed: COLONOSCOPY POLYPECTOMY, INTESTINE  Patient Location: endo   Anesthesia Type:General  Level of Consciousness: drowsy  Airway & Oxygen Therapy: Patient Spontanous Breathing  Post-op Assessment: Report given to RN  Post vital signs: stable  Last Vitals:  Vitals Value Taken Time  BP    Temp    Pulse 70 04/20/24 07:53  Resp 14 04/20/24 07:53  SpO2 98 % 04/20/24 07:53  Vitals shown include unfiled device data.  Last Pain:  Vitals:   04/20/24 0707  PainSc: 0-No pain         Complications: No notable events documented.

## 2024-04-20 NOTE — Op Note (Signed)
 [facility] Patient Name: Tiffany Burnett Procedure Date: 04/20/2024 7:34 AM MRN: [id number] Account #: [id number] Date of Birth: 05/03/1966 Admit Type: Outpatient Age: 58 Room: [facility] ENDO ROOM 4 Gender: Female Note Status: Finalized Instrument Name: Hyman Main [id number] Procedure:             Colonoscopy Indications:           Screening for colorectal malignant neoplasm Providers:             Marnee Sink MD, MD Referring MD:          Laurence Pons (Referring MD) Medicines:             Propofol per Anesthesia Complications:         No immediate complications. Procedure:             Pre-Anesthesia Assessment:                        - Prior to the procedure, a History and Physical was                         performed, and patient medications and allergies were                         reviewed. The patient's tolerance of previous                         anesthesia was also reviewed. The risks and benefits                         of the procedure and the sedation options and risks                         were discussed with the patient. All questions were                         answered, and informed consent was obtained. Prior                         Anticoagulants: The patient has taken no anticoagulant                         or antiplatelet agents. ASA Grade Assessment: II - A                         patient with mild systemic disease. After reviewing                         the risks and benefits, the patient was deemed in                         satisfactory condition to undergo the procedure.                        After obtaining informed consent, the colonoscope was                         passed under direct vision. Throughout the procedure,  the patient's blood pressure, pulse, and oxygen                         saturations were monitored continuously. The                         Colonoscope was introduced through the anus  and                         advanced to the the cecum, identified by appendiceal                         orifice and ileocecal valve. The colonoscopy was                         performed without difficulty. The patient tolerated                         the procedure well. The quality of the bowel                         preparation was excellent. Findings:      The perianal and digital rectal examinations were normal.      A 3 mm polyp was found in the descending colon. The polyp was sessile.       The polyp was removed with a cold snare. Resection and retrieval were       complete.      A 9 mm polyp was found in the sigmoid colon. The polyp was pedunculated.       The polyp was removed with a hot snare. Resection and retrieval were       complete.      Multiple small-mouthed diverticula were found in the sigmoid colon.      Non-bleeding internal hemorrhoids were found during retroflexion. The       hemorrhoids were Grade I (internal hemorrhoids that do not prolapse). Impression:            - One 3 mm polyp in the descending colon, removed with                         a cold snare. Resected and retrieved.                        - One 9 mm polyp in the sigmoid colon, removed with a                         hot snare. Resected and retrieved.                        - Diverticulosis in the sigmoid colon.                        - Non-bleeding internal hemorrhoids. Recommendation:        - Discharge patient to home.                        - Resume previous diet.                        -  Continue present medications.                        - Await pathology results.                        - If the pathology report reveals adenomatous tissue,                         then repeat the colonoscopy for surveillance in 7                         years. Procedure Code(s):     --- Professional ---                        [phone], Colonoscopy, flexible; with removal of                         tumor(s),  polyp(s), or other lesion(s) by snare                         technique Diagnosis Code(s):     --- Professional ---                        Z12.11, Encounter for screening for malignant neoplasm                         of colon                        D12.5, Benign neoplasm of sigmoid colon CPT copyright 2022 American [employer] Association. All rights reserved. The codes documented in this report are preliminary and upon coder review may  be revised to meet current compliance requirements. Marnee Sink MD, MD 04/20/2024 7:54:01 AM This report has been signed electronically. Number of Addenda: 0 Note Initiated On: 04/20/2024 7:34 AM Scope Withdrawal Time: 0 hours 7 minutes 58 seconds  Total Procedure Duration: 0 hours 10 minutes 50 seconds  Estimated Blood Loss:  Estimated blood loss: none.      [facility]

## 2024-04-21 ENCOUNTER — Ambulatory Visit: Payer: Self-pay | Admitting: Gastroenterology

## 2024-04-21 LAB — SURGICAL PATHOLOGY

## 2024-04-30 ENCOUNTER — Other Ambulatory Visit: Payer: Self-pay | Admitting: Nurse Practitioner

## 2024-04-30 DIAGNOSIS — F411 Generalized anxiety disorder: Secondary | ICD-10-CM

## 2024-06-08 ENCOUNTER — Ambulatory Visit (INDEPENDENT_AMBULATORY_CARE_PROVIDER_SITE_OTHER): Admitting: Nurse Practitioner

## 2024-06-08 ENCOUNTER — Encounter: Payer: Self-pay | Admitting: Nurse Practitioner

## 2024-06-08 VITALS — BP 128/76 | HR 64 | Temp 97.5°F | Resp 16 | Ht 66.0 in | Wt 153.0 lb

## 2024-06-08 DIAGNOSIS — G4709 Other insomnia: Secondary | ICD-10-CM

## 2024-06-08 DIAGNOSIS — E785 Hyperlipidemia, unspecified: Secondary | ICD-10-CM

## 2024-06-08 DIAGNOSIS — E1169 Type 2 diabetes mellitus with other specified complication: Secondary | ICD-10-CM | POA: Diagnosis not present

## 2024-06-08 MED ORDER — TIRZEPATIDE 5 MG/0.5ML ~~LOC~~ SOAJ: 5.0000 mg | SUBCUTANEOUS | 5 refills | Status: DC

## 2024-06-08 MED ORDER — ZALEPLON 10 MG PO CAPS: 10.0000 mg | ORAL_CAPSULE | Freq: Every evening | ORAL | 2 refills | Status: DC | PRN

## 2024-06-08 NOTE — Progress Notes (Unsigned)
 [facility] [address] [place], [place] [address]  Internal MEDICINE  Office Visit Note  Patient Name: Tiffany Burnett  [id number]  [id number]  Date of Service: 06/08/2024  Chief Complaint  Patient presents with   Follow-up    HPI Tiffany Burnett presents for a follow-up visit for high cholesterol, diabetes and insomnia.  High cholesterol -- taking simvastatin  daily Insomnia --this has greatly improved with 10 mg of zaleplon  at bedtime. She would like to continue the medication  Diabetes -- trying mounjaro  currently to help control her A1c. Last A1c was well controlled.     Current Medication: Outpatient Encounter Medications as of 06/08/2024  Medication Sig   Accu-Chek Softclix Lancets lancets Use 1 lancet to check glucose once daily and as needed for diabetes   Blood Glucose Monitoring Suppl (ACCU-CHEK GUIDE ME) w/Device KIT Use as directed. E11.65   busPIRone  (BUSPAR ) 15 MG tablet TAKE 1 TABLET BY MOUTH 3 TIMES DAILY.   escitalopram  (LEXAPRO ) 20 MG tablet Take 1 tablet (20 mg total) by mouth daily.   glucose blood (ACCU-CHEK GUIDE TEST) test strip Use 1 test strip 3 times daily as needed to check glucose for diabetes.   montelukast  (SINGULAIR ) 10 MG tablet Take 1 tablet (10 mg total) by mouth at bedtime.   propranolol (INDERAL) 20 MG tablet Take 20 mg by mouth 2 (two) times daily as needed.   simvastatin  (ZOCOR ) 40 MG tablet Take 1 tablet (40 mg total) by mouth at bedtime.   tirzepatide  (MOUNJARO ) 5 MG/0.5ML Pen Inject 5 mg into the skin once a week.   zaleplon  (SONATA ) 10 MG capsule Take 1 capsule (10 mg total) by mouth at bedtime as needed for sleep.   [DISCONTINUED] tirzepatide  (MOUNJARO ) 5 MG/0.5ML Pen Inject 5 mg into the skin once a week.   [DISCONTINUED] zaleplon  (SONATA ) 10 MG capsule Take 1 capsule (10 mg total) by mouth at bedtime as needed for sleep.   No facility-administered encounter medications on file as of 06/08/2024.    Surgical History: Past Surgical History:   Procedure Laterality Date   ABDOMINAL HYSTERECTOMY     COLONOSCOPY N/A 04/20/2024   Procedure: COLONOSCOPY;  Surgeon: Jinny Carmine, MD;  Location: [facility] ENDOSCOPY;  Service: Endoscopy;  Laterality: N/A;   POLYPECTOMY  04/20/2024   Procedure: POLYPECTOMY, INTESTINE;  Surgeon: Jinny Carmine, MD;  Location: [facility] ENDOSCOPY;  Service: Endoscopy;;    Medical History: History reviewed. No pertinent past medical history.  Family History: Family History  Problem Relation Age of Onset   Breast cancer Mother 69   COPD Mother    Dementia Father     Social History   Socioeconomic History   Marital status: Single    Spouse name: Not on file   Number of children: Not on file   Years of education: Not on file   Highest education level: Not on file  Occupational History   Not on file  Tobacco Use   Smoking status: Former    Types: Cigarettes   Smokeless tobacco: Never  Substance and Sexual Activity   Alcohol use: Not Currently   Drug use: Never   Sexual activity: Not Currently  Other Topics Concern   Not on file  Social History Narrative   Not on file   Social Drivers of Health   Financial Resource Strain: Low Risk  (08/05/2023)   Received from [facility]   Overall Financial Resource Strain (CARDIA)    Difficulty of Paying Living Expenses: Not hard at all  Food Insecurity: No Food Insecurity (08/05/2023)   Received from [facility]   Hunger Vital Sign    Within the past 12 months, you worried that your food would run out before you got the money to buy more.: Never true    Within the past 12 months, the food you bought just didn't last and you didn't have money to get more.: Never true  Transportation Needs: No Transportation Needs (08/05/2023)   Received from [facility] - Transportation    In the past 12 months, has lack of transportation kept you from medical appointments or from getting medications?: No    Lack of  Transportation (Non-Medical): No  Physical Activity: Not on file  Stress: Not on file  Social Connections: Not on file  Intimate Partner Violence: Not on file      Review of Systems  Constitutional:  Positive for unexpected weight change. Negative for chills and fatigue.  HENT:  Positive for postnasal drip, rhinorrhea and sneezing. Negative for congestion and sore throat.   Eyes:  Negative for redness.  Respiratory: Negative.  Negative for cough, chest tightness, shortness of breath and wheezing.   Cardiovascular: Negative.  Negative for chest pain and palpitations.  Gastrointestinal:  Positive for nausea. Negative for abdominal pain, constipation, diarrhea and vomiting.  Genitourinary:  Negative for dysuria and frequency.  Musculoskeletal:  Negative for arthralgias, back pain, joint swelling and neck pain.  Skin:  Negative for rash.  Neurological: Negative.  Negative for tremors and numbness.  Hematological:  Negative for adenopathy. Does not bruise/bleed easily.  Psychiatric/Behavioral:  Positive for sleep disturbance. Negative for behavioral problems (Depression), self-injury and suicidal ideas. The patient is nervous/anxious.     Vital Signs: BP 128/76   Pulse 64   Temp (!) 97.5 F (36.4 C)   Resp 16   Ht 5' 6 (1.676 m)   Wt 153 lb (69.4 kg)   SpO2 97%   BMI 24.69 kg/m    Physical Exam Vitals reviewed.  Constitutional:      General: She is not in acute distress.    Appearance: Normal appearance. She is obese. She is not ill-appearing.  HENT:     Head: Normocephalic and atraumatic.  Eyes:     Pupils: Pupils are equal, round, and reactive to light.  Cardiovascular:     Rate and Rhythm: Normal rate and regular rhythm.  Pulmonary:     Effort: Pulmonary effort is normal. No respiratory distress.  Neurological:     Mental Status: She is alert and oriented to person, place, and time.  Psychiatric:        Mood and Affect: Mood normal.        Behavior: Behavior  normal.        Assessment/Plan: 1. Other insomnia Continue zaleplon  as prescribed.  - zaleplon  (SONATA ) 10 MG capsule; Take 1 capsule (10 mg total) by mouth at bedtime as needed for sleep.  Dispense: 30 capsule; Refill: 2  2. Type 2 diabetes mellitus with other specified complication, without long-term current use of insulin (HCC) Continue mounjaro  as prescribed.  - tirzepatide  (MOUNJARO ) 5 MG/0.5ML Pen; Inject 5 mg into the skin once a week.  Dispense: 2 mL; Refill: 5  3. Hyperlipidemia associated with type 2 diabetes mellitus (HCC) (Primary) Continue simvastatin  as prescribed.    General Counseling: lashanna angelo understanding of the findings of todays visit and agrees with plan of treatment. I have discussed any further diagnostic evaluation that may  be needed or ordered today. We also reviewed her medications today. she has been encouraged to call the office with any questions or concerns that should arise related to todays visit.    No orders of the defined types were placed in this encounter.   Meds ordered this encounter  Medications   zaleplon  (SONATA ) 10 MG capsule    Sig: Take 1 capsule (10 mg total) by mouth at bedtime as needed for sleep.    Dispense:  30 capsule    Refill:  2    For future refills   tirzepatide  (MOUNJARO ) 5 MG/0.5ML Pen    Sig: Inject 5 mg into the skin once a week.    Dispense:  2 mL    Refill:  5    Dx code E11.65    Return in about 3 months (around 09/01/2024) for F/U, Deniss Wormley PCP zaleplon  refills. .   Total time spent:30 Minutes Time spent includes review of chart, medications, test results, and follow up plan with the patient.   [place] Controlled Substance Database was reviewed by me.  This patient was seen by Mardy Maxin, FNP-C in collaboration with Dr. Sigrid Bathe as a part of collaborative care agreement.   Lakyn Alsteen R. Maxin, MSN, FNP-C Internal medicine

## 2024-06-29 ENCOUNTER — Encounter: Payer: Self-pay | Admitting: Nurse Practitioner

## 2024-09-01 ENCOUNTER — Ambulatory Visit (INDEPENDENT_AMBULATORY_CARE_PROVIDER_SITE_OTHER): Admitting: Nurse Practitioner

## 2024-09-01 ENCOUNTER — Encounter: Payer: Self-pay | Admitting: Nurse Practitioner

## 2024-09-01 VITALS — BP 122/74 | HR 83 | Temp 96.3°F | Resp 16 | Ht 66.0 in | Wt 146.0 lb

## 2024-09-01 DIAGNOSIS — G4709 Other insomnia: Secondary | ICD-10-CM

## 2024-09-01 DIAGNOSIS — E785 Hyperlipidemia, unspecified: Secondary | ICD-10-CM

## 2024-09-01 DIAGNOSIS — E1169 Type 2 diabetes mellitus with other specified complication: Secondary | ICD-10-CM | POA: Diagnosis not present

## 2024-09-01 DIAGNOSIS — E559 Vitamin D deficiency, unspecified: Secondary | ICD-10-CM

## 2024-09-01 MED ORDER — ZALEPLON 10 MG PO CAPS: 10.0000 mg | ORAL_CAPSULE | Freq: Every evening | ORAL | 2 refills | Status: DC | PRN

## 2024-09-01 NOTE — Progress Notes (Signed)
 [facility] [address] [place], [place] [address]  Internal MEDICINE  Office Visit Note  Patient Name: Tiffany Burnett  [id number]  [id number]  Date of Service: 09/01/2024  Chief Complaint  Patient presents with   Follow-up    HPI [name] presents for a follow-up visit for diabetes, high cholesterol, insomnia, and lab orders.  Diabetes -- last A1c was 6.3 in September last year. Due for urine microalbumin/creatinine ratio.  High triglycerides -- level was at 231 last year, due to repeat labs  Insomnia -- doing well with zaleplon , due for refills Has a history of anemia.     Current Medication: Outpatient Encounter Medications as of 09/01/2024  Medication Sig   Accu-Chek Softclix Lancets lancets Use 1 lancet to check glucose once daily and as needed for diabetes   Blood Glucose Monitoring Suppl (ACCU-CHEK GUIDE ME) w/Device KIT Use as directed. E11.65   busPIRone  (BUSPAR ) 15 MG tablet TAKE 1 TABLET BY MOUTH 3 TIMES DAILY.   escitalopram  (LEXAPRO ) 20 MG tablet Take 1 tablet (20 mg total) by mouth daily.   glucose blood (ACCU-CHEK GUIDE TEST) test strip Use 1 test strip 3 times daily as needed to check glucose for diabetes.   montelukast  (SINGULAIR ) 10 MG tablet Take 1 tablet (10 mg total) by mouth at bedtime.   propranolol (INDERAL) 20 MG tablet Take 20 mg by mouth 2 (two) times daily as needed.   simvastatin  (ZOCOR ) 40 MG tablet Take 1 tablet (40 mg total) by mouth at bedtime.   tirzepatide  (MOUNJARO ) 5 MG/0.5ML Pen Inject 5 mg into the skin once a week.   zaleplon  (SONATA ) 10 MG capsule Take 1 capsule (10 mg total) by mouth at bedtime as needed for sleep.   [DISCONTINUED] zaleplon  (SONATA ) 10 MG capsule Take 1 capsule (10 mg total) by mouth at bedtime as needed for sleep.   No facility-administered encounter medications on file as of 09/01/2024.    Surgical History: Past Surgical History:  Procedure Laterality Date   ABDOMINAL HYSTERECTOMY     COLONOSCOPY N/A  04/20/2024   Procedure: COLONOSCOPY;  Surgeon: Jinny Carmine, MD;  Location: [facility] ENDOSCOPY;  Service: Endoscopy;  Laterality: N/A;   POLYPECTOMY  04/20/2024   Procedure: POLYPECTOMY, INTESTINE;  Surgeon: Jinny Carmine, MD;  Location: [facility] ENDOSCOPY;  Service: Endoscopy;;    Medical History: History reviewed. No pertinent past medical history.  Family History: Family History  Problem Relation Age of Onset   Breast cancer Mother 25   COPD Mother    Dementia Father     Social History   Socioeconomic History   Marital status: Single    Spouse name: Not on file   Number of children: Not on file   Years of education: Not on file   Highest education level: Not on file  Occupational History   Not on file  Tobacco Use   Smoking status: Former    Types: Cigarettes   Smokeless tobacco: Never  Substance and Sexual Activity   Alcohol use: Not Currently   Drug use: Never   Sexual activity: Not Currently  Other Topics Concern   Not on file  Social History Narrative   Not on file   Social Drivers of Health   Financial Resource Strain: Low Risk  (08/05/2023)   Received from [facility]   Overall Financial Resource Strain (CARDIA)    Difficulty of Paying Living Expenses: Not hard at all  Food Insecurity: No Food Insecurity (08/05/2023)   Received from [facility]   Hunger Vital Sign    Within the past 12 months, you worried that your food would run out before you got the money to buy more.: Never true    Within the past 12 months, the food you bought just didn't last and you didn't have money to get more.: Never true  Transportation Needs: No Transportation Needs (08/05/2023)   Received from [facility] - Transportation    In the past 12 months, has lack of transportation kept you from medical appointments or from getting medications?: No    Lack of Transportation (Non-Medical): No  Physical Activity: Not on file   Stress: Not on file  Social Connections: Not on file  Intimate Partner Violence: Not on file      Review of Systems  Constitutional:  Positive for unexpected weight change. Negative for chills and fatigue.  HENT:  Positive for postnasal drip, rhinorrhea and sneezing. Negative for congestion and sore throat.   Eyes:  Negative for redness.  Respiratory: Negative.  Negative for cough, chest tightness, shortness of breath and wheezing.   Cardiovascular: Negative.  Negative for chest pain and palpitations.  Gastrointestinal:  Positive for nausea. Negative for abdominal pain, constipation, diarrhea and vomiting.  Genitourinary:  Negative for dysuria and frequency.  Musculoskeletal:  Negative for arthralgias, back pain, joint swelling and neck pain.  Skin:  Negative for rash.  Neurological: Negative.  Negative for tremors and numbness.  Hematological:  Negative for adenopathy. Does not bruise/bleed easily.  Psychiatric/Behavioral:  Positive for sleep disturbance. Negative for behavioral problems (Depression), self-injury and suicidal ideas. The patient is nervous/anxious.     Vital Signs: BP 122/74   Pulse 83   Temp (!) 96.3 F (35.7 C)   Resp 16   Ht 5' 6 (1.676 m)   Wt 146 lb (66.2 kg)   SpO2 96%   BMI 23.57 kg/m    Physical Exam Vitals reviewed.  Constitutional:      General: She is not in acute distress.    Appearance: Normal appearance. She is obese. She is not ill-appearing.  HENT:     Head: Normocephalic and atraumatic.  Eyes:     Pupils: Pupils are equal, round, and reactive to light.  Cardiovascular:     Rate and Rhythm: Normal rate and regular rhythm.  Pulmonary:     Effort: Pulmonary effort is normal. No respiratory distress.  Neurological:     Mental Status: She is alert and oriented to person, place, and time.  Psychiatric:        Mood and Affect: Mood normal.        Behavior: Behavior normal.        Assessment/Plan: 1. Type 2 diabetes mellitus  with other specified complication, without long-term current use of insulin (HCC) (Primary) Routine labs ordered  - Urine Microalbumin w/creat. ratio - CBC with Differential/Platelet - CMP14+EGFR - Lipid Profile - Hgb A1C w/o eAG - Vitamin D (25 hydroxy)  2. Hyperlipidemia associated with type 2 diabetes mellitus (HCC) Routine labs ordered  - Urine Microalbumin w/creat. ratio - CBC with Differential/Platelet - CMP14+EGFR - Lipid Profile - Hgb A1C w/o eAG - Vitamin D (25 hydroxy)  3. Vitamin D deficiency Routine labs ordered  - Urine Microalbumin w/creat. ratio - CBC with Differential/Platelet - CMP14+EGFR - Lipid Profile - Hgb A1C w/o eAG - Vitamin D (25 hydroxy)  4. Other insomnia Continue zaleplon  as prescribed. Follow up in 3 months for additional refills  -  zaleplon  (SONATA ) 10 MG capsule; Take 1 capsule (10 mg total) by mouth at bedtime as needed for sleep.  Dispense: 30 capsule; Refill: 2   General Counseling: Mertice verbalizes understanding of the findings of todays visit and agrees with plan of treatment. I have discussed any further diagnostic evaluation that may be needed or ordered today. We also reviewed her medications today. she has been encouraged to call the office with any questions or concerns that should arise related to todays visit.    Orders Placed This Encounter  Procedures   Urine Microalbumin w/creat. ratio   CBC with Differential/Platelet   CMP14+EGFR   Lipid Profile   Hgb A1C w/o eAG   Vitamin D (25 hydroxy)    Meds ordered this encounter  Medications   zaleplon  (SONATA ) 10 MG capsule    Sig: Take 1 capsule (10 mg total) by mouth at bedtime as needed for sleep.    Dispense:  30 capsule    Refill:  2    For future refills    Return in about 5 weeks (around 10/07/2024) for F/U, Labs, Katara Griner PCP.   Total time spent:30 Minutes Time spent includes review of chart, medications, test results, and follow up plan with the patient.   [place]  Controlled Substance Database was reviewed by me.  This patient was seen by Mardy Maxin, FNP-C in collaboration with Dr. Sigrid Bathe as a part of collaborative care agreement.   Kailei Cowens R. Maxin, MSN, FNP-C Internal medicine

## 2024-09-02 LAB — MICROALBUMIN / CREATININE URINE RATIO
Creatinine, Urine: 229.3 mg/dL
Microalb/Creat Ratio: 10 mg/g{creat} (ref 0–29)
Microalbumin, Urine: 23.6 ug/mL

## 2024-09-21 ENCOUNTER — Encounter: Payer: Self-pay | Admitting: Nurse Practitioner

## 2024-10-06 ENCOUNTER — Ambulatory Visit: Admitting: Nurse Practitioner

## 2024-10-08 LAB — CMP14+EGFR
ALT: 17 IU/L (ref 0–32)
AST: 16 IU/L (ref 0–40)
Albumin: 4.5 g/dL (ref 3.8–4.9)
Alkaline Phosphatase: 77 IU/L (ref 49–135)
BUN/Creatinine Ratio: 11 (ref 9–23)
BUN: 12 mg/dL (ref 6–24)
Bilirubin Total: 0.3 mg/dL (ref 0.0–1.2)
CO2: 27 mmol/L (ref 20–29)
Calcium: 9.5 mg/dL (ref 8.7–10.2)
Chloride: 105 mmol/L (ref 96–106)
Creatinine, Ser: 1.06 mg/dL — ABNORMAL HIGH (ref 0.57–1.00)
Globulin, Total: 2.1 g/dL (ref 1.5–4.5)
Glucose: 111 mg/dL — ABNORMAL HIGH (ref 70–99)
Potassium: 4.7 mmol/L (ref 3.5–5.2)
Sodium: 144 mmol/L (ref 134–144)
Total Protein: 6.6 g/dL (ref 6.0–8.5)
eGFR: 61 mL/min/1.73 (ref >59)

## 2024-10-08 LAB — LIPID PANEL
Chol/HDL Ratio: 4.7 ratio — ABNORMAL HIGH (ref 0.0–4.4)
Cholesterol, Total: 175 mg/dL (ref 100–199)
HDL: 37 mg/dL — ABNORMAL LOW (ref >39)
LDL Chol Calc (NIH): 93 mg/dL (ref 0–99)
Triglycerides: 265 mg/dL — ABNORMAL HIGH (ref 0–149)
VLDL Cholesterol Cal: 45 mg/dL — ABNORMAL HIGH (ref 5–40)

## 2024-10-08 LAB — CBC WITH DIFFERENTIAL/PLATELET
Basophils Absolute: 0 x10E3/uL (ref 0.0–0.2)
Basos: 1 %
EOS (ABSOLUTE): 0.1 x10E3/uL (ref 0.0–0.4)
Eos: 1 %
Hematocrit: 41.4 % (ref 34.0–46.6)
Hemoglobin: 13.8 g/dL (ref 11.1–15.9)
Immature Grans (Abs): 0 x10E3/uL (ref 0.0–0.1)
Immature Granulocytes: 0 %
Lymphocytes Absolute: 2.9 x10E3/uL (ref 0.7–3.1)
Lymphs: 48 %
MCH: 32.5 pg (ref 26.6–33.0)
MCHC: 33.3 g/dL (ref 31.5–35.7)
MCV: 98 fL — ABNORMAL HIGH (ref 79–97)
Monocytes Absolute: 0.4 x10E3/uL (ref 0.1–0.9)
Monocytes: 7 %
Neutrophils Absolute: 2.7 x10E3/uL (ref 1.4–7.0)
Neutrophils: 43 %
Platelets: 335 x10E3/uL (ref 150–450)
RBC: 4.24 x10E6/uL (ref 3.77–5.28)
RDW: 11.8 % (ref 11.7–15.4)
WBC: 6.1 x10E3/uL (ref 3.4–10.8)

## 2024-10-08 LAB — VITAMIN D 25 HYDROXY (VIT D DEFICIENCY, FRACTURES): Vit D, 25-Hydroxy: 32 ng/mL (ref 30.0–100.0)

## 2024-10-08 LAB — HGB A1C W/O EAG: Hgb A1c MFr Bld: 5.4 % (ref 4.8–5.6)

## 2024-10-12 ENCOUNTER — Ambulatory Visit (INDEPENDENT_AMBULATORY_CARE_PROVIDER_SITE_OTHER): Admitting: Nurse Practitioner

## 2024-10-12 ENCOUNTER — Encounter: Payer: Self-pay | Admitting: Nurse Practitioner

## 2024-10-12 VITALS — BP 126/80 | HR 70 | Temp 98.1°F | Resp 16 | Ht 66.0 in | Wt 143.4 lb

## 2024-10-12 DIAGNOSIS — E1169 Type 2 diabetes mellitus with other specified complication: Secondary | ICD-10-CM

## 2024-10-12 DIAGNOSIS — N289 Disorder of kidney and ureter, unspecified: Secondary | ICD-10-CM

## 2024-10-12 DIAGNOSIS — E785 Hyperlipidemia, unspecified: Secondary | ICD-10-CM

## 2024-10-12 DIAGNOSIS — G4709 Other insomnia: Secondary | ICD-10-CM

## 2024-10-12 NOTE — Progress Notes (Unsigned)
 [facility] [address] [place], [place] [address]  Internal MEDICINE  Office Visit Note  Patient Name: Tiffany Burnett  [id number]  [id number]  Date of Service: 10/12/2024  Chief Complaint  Patient presents with   Follow-up   Diabetes    HPI Tiffany Burnett presents for a follow-up visit for      Current Medication: Outpatient Encounter Medications as of 10/12/2024  Medication Sig   Accu-Chek Softclix Lancets lancets Use 1 lancet to check glucose once daily and as needed for diabetes   Blood Glucose Monitoring Suppl (ACCU-CHEK GUIDE ME) w/Device KIT Use as directed. E11.65   busPIRone  (BUSPAR ) 15 MG tablet TAKE 1 TABLET BY MOUTH 3 TIMES DAILY.   escitalopram  (LEXAPRO ) 20 MG tablet Take 1 tablet (20 mg total) by mouth daily.   glucose blood (ACCU-CHEK GUIDE TEST) test strip Use 1 test strip 3 times daily as needed to check glucose for diabetes.   montelukast  (SINGULAIR ) 10 MG tablet Take 1 tablet (10 mg total) by mouth at bedtime.   propranolol (INDERAL) 20 MG tablet Take 20 mg by mouth 2 (two) times daily as needed.   simvastatin  (ZOCOR ) 40 MG tablet Take 1 tablet (40 mg total) by mouth at bedtime.   tirzepatide  (MOUNJARO ) 5 MG/0.5ML Pen Inject 5 mg into the skin once a week.   Tiffany  (SONATA ) 10 MG capsule Take 1 capsule (10 mg total) by mouth at bedtime as needed for sleep.   No facility-administered encounter medications on file as of 10/12/2024.    Surgical History: Past Surgical History:  Procedure Laterality Date   ABDOMINAL HYSTERECTOMY     COLONOSCOPY N/A 04/20/2024   Procedure: COLONOSCOPY;  Surgeon: Jinny Carmine, MD;  Location: [facility] ENDOSCOPY;  Service: Endoscopy;  Laterality: N/A;   POLYPECTOMY  04/20/2024   Procedure: POLYPECTOMY, INTESTINE;  Surgeon: Jinny Carmine, MD;  Location: [facility] ENDOSCOPY;  Service: Endoscopy;;    Medical History: Past Medical History:  Diagnosis Date   Diabetes mellitus without complication (HCC)     Family History: Family  History  Problem Relation Age of Onset   Breast cancer Mother 71   COPD Mother    Dementia Father     Social History   Socioeconomic History   Marital status: Single    Spouse name: Not on file   Number of children: Not on file   Years of education: Not on file   Highest education level: Not on file  Occupational History   Not on file  Tobacco Use   Smoking status: Former    Types: Cigarettes   Smokeless tobacco: Never  Substance and Sexual Activity   Alcohol use: Not Currently   Drug use: Never   Sexual activity: Not Currently  Other Topics Concern   Not on file  Social History Narrative   Not on file   Social Drivers of Health   Financial Resource Strain: Low Risk  (08/05/2023)   Received from [facility]   Overall Financial Resource Strain (CARDIA)    Difficulty of Paying Living Expenses: Not hard at all  Food Insecurity: No Food Insecurity (08/05/2023)   Received from [facility]   Hunger Vital Sign    Within the past 12 months, you worried that your food would run out before you got the money to buy more.: Never true    Within the past 12 months, the food you bought just didn't last and you didn't have money to get more.: Never true  Transportation  Needs: No Transportation Needs (08/05/2023)   Received from [facility] - Transportation    In the past 12 months, has lack of transportation kept you from medical appointments or from getting medications?: No    Lack of Transportation (Non-Medical): No  Physical Activity: Not on file  Stress: Not on file  Social Connections: Not on file  Intimate Partner Violence: Not on file      Review of Systems  Vital Signs: BP 126/80   Pulse 70   Temp 98.1 F (36.7 C)   Resp 16   Ht 5' 6 (1.676 m)   Wt 143 lb 6.4 oz (65 kg)   SpO2 99%   BMI 23.15 kg/m    Physical Exam     Assessment/Plan:   General Counseling: Tiffany Burnett verbalizes understanding  of the findings of todays visit and agrees with plan of treatment. I have discussed any further diagnostic evaluation that may be needed or ordered today. We also reviewed her medications today. she has been encouraged to call the office with any questions or concerns that should arise related to todays visit.    No orders of the defined types were placed in this encounter.   No orders of the defined types were placed in this encounter.   Return in about 6 weeks (around 11/26/2024) for F/U, Tiffany Burnett PCP zalelpon Burnett. .   Total time spent:*** Minutes Time spent includes review of chart, medications, test results, and follow up plan with the patient.   [place] Controlled Substance Database was reviewed by me.  This patient was seen by Tiffany Maxin, FNP-C in collaboration with Dr. Sigrid Burnett as a part of collaborative care agreement.   Tiffany Brand R. Maxin, MSN, FNP-C Internal medicine

## 2024-10-13 ENCOUNTER — Encounter: Payer: Self-pay | Admitting: Nurse Practitioner

## 2024-10-16 ENCOUNTER — Encounter: Payer: Self-pay | Admitting: Nurse Practitioner

## 2024-10-16 ENCOUNTER — Ambulatory Visit: Admitting: Nurse Practitioner

## 2024-10-16 VITALS — BP 130/82 | HR 75 | Temp 96.5°F | Resp 16 | Ht 66.0 in | Wt 143.0 lb

## 2024-10-16 DIAGNOSIS — R300 Dysuria: Secondary | ICD-10-CM

## 2024-10-16 DIAGNOSIS — R319 Hematuria, unspecified: Secondary | ICD-10-CM

## 2024-10-16 DIAGNOSIS — N390 Urinary tract infection, site not specified: Secondary | ICD-10-CM

## 2024-10-16 LAB — POCT URINALYSIS DIPSTICK
Bilirubin, UA: NEGATIVE
Glucose, UA: NEGATIVE
Leukocytes, UA: NEGATIVE
Nitrite, UA: NEGATIVE
Protein, UA: NEGATIVE
Spec Grav, UA: 1.015 (ref 1.010–1.025)
Urobilinogen, UA: 0.2 U/dL
pH, UA: 6.5 (ref 5.0–8.0)

## 2024-10-16 MED ORDER — NITROFURANTOIN MONOHYD MACRO 100 MG PO CAPS: 100.0000 mg | ORAL_CAPSULE | Freq: Two times a day (BID) | ORAL | 0 refills | Status: AC

## 2024-10-16 NOTE — Progress Notes (Signed)
 [facility] [address] [place], [place] [address]  Internal MEDICINE  Office Visit Note  Patient Name: Tiffany Burnett  [id number]  [id number]  Date of Service: 10/16/2024  Chief Complaint  Patient presents with   Acute Visit    uti     HPI Tiffany Burnett presents for an acute sick visit for possible UTI --onset of symptoms was 2 days ago.  --reports frequency, urgency, burning with urination, urinary discomfort, fatigue.  --denies any back pain or flank pain.  --urinalysis positive for trace blood, negative for leukocytes and nitrites.    Current Medication:  Outpatient Encounter Medications as of 10/16/2024  Medication Sig   nitrofurantoin, macrocrystal-monohydrate, (MACROBID) 100 MG capsule Take 1 capsule (100 mg total) by mouth 2 (two) times daily for 7 days. May take with food   Accu-Chek Softclix Lancets lancets Use 1 lancet to check glucose once daily and as needed for diabetes   Blood Glucose Monitoring Suppl (ACCU-CHEK GUIDE ME) w/Device KIT Use as directed. E11.65   busPIRone  (BUSPAR ) 15 MG tablet TAKE 1 TABLET BY MOUTH 3 TIMES DAILY.   escitalopram  (LEXAPRO ) 20 MG tablet Take 1 tablet (20 mg total) by mouth daily.   glucose blood (ACCU-CHEK GUIDE TEST) test strip Use 1 test strip 3 times daily as needed to check glucose for diabetes.   montelukast  (SINGULAIR ) 10 MG tablet Take 1 tablet (10 mg total) by mouth at bedtime.   propranolol (INDERAL) 20 MG tablet Take 20 mg by mouth 2 (two) times daily as needed.   simvastatin  (ZOCOR ) 40 MG tablet Take 1 tablet (40 mg total) by mouth at bedtime.   tirzepatide  (MOUNJARO ) 5 MG/0.5ML Pen Inject 5 mg into the skin once a week.   zaleplon  (SONATA ) 10 MG capsule Take 1 capsule (10 mg total) by mouth at bedtime as needed for sleep.   No facility-administered encounter medications on file as of 10/16/2024.      Medical History: Past Medical History:  Diagnosis Date   Diabetes mellitus without complication (HCC)       Vital Signs: BP 130/82   Pulse 75   Temp (!) 96.5 F (35.8 C)   Resp 16   Ht 5' 6 (1.676 m)   Wt 143 lb (64.9 kg)   SpO2 99%   BMI 23.08 kg/m    Review of Systems  Physical Exam    Assessment/Plan: 1. Urinary tract infection with hematuria, site unspecified (Primary) Urine sent for culture. Nitrofurantoin prescribed, take until gone.  - CULTURE, URINE COMPREHENSIVE - nitrofurantoin, macrocrystal-monohydrate, (MACROBID) 100 MG capsule; Take 1 capsule (100 mg total) by mouth 2 (two) times daily for 7 days. May take with food  Dispense: 14 capsule; Refill: 0  2. Dysuria Urinalysis abnormal, urine sent for culture.  - POCT urinalysis dipstick   General Counseling: [name] verbalizes understanding of the findings of todays visit and agrees with plan of treatment. I have discussed any further diagnostic evaluation that may be needed or ordered today. We also reviewed her medications today. she has been encouraged to call the office with any questions or concerns that should arise related to todays visit.    Counseling:    Orders Placed This Encounter  Procedures   CULTURE, URINE COMPREHENSIVE   POCT urinalysis dipstick    Meds ordered this encounter  Medications   nitrofurantoin, macrocrystal-monohydrate, (MACROBID) 100 MG capsule    Sig: Take 1 capsule (100 mg total) by mouth 2 (two) times daily for 7 days. May take with  food    Dispense:  14 capsule    Refill:  0    Fill new script today.    Return if symptoms worsen or fail to improve.  [place] Controlled Substance Database was reviewed by me for overdose risk score (ORS)  Time spent:20 Minutes Time spent with patient included reviewing progress notes, labs, imaging studies, and discussing plan for follow up.   This patient was seen by Mardy Maxin, FNP-C in collaboration with Dr. Sigrid Bathe as a part of collaborative care agreement.  Cattaleya Wien R. Maxin, MSN, FNP-C Internal Medicine

## 2024-10-20 LAB — CULTURE, URINE COMPREHENSIVE

## 2024-11-26 ENCOUNTER — Ambulatory Visit (INDEPENDENT_AMBULATORY_CARE_PROVIDER_SITE_OTHER): Admitting: Nurse Practitioner

## 2024-11-26 ENCOUNTER — Encounter: Payer: Self-pay | Admitting: Nurse Practitioner

## 2024-11-26 VITALS — BP 130/80 | HR 61 | Temp 97.1°F | Resp 16 | Ht 66.0 in | Wt 136.4 lb

## 2024-11-26 DIAGNOSIS — E1169 Type 2 diabetes mellitus with other specified complication: Secondary | ICD-10-CM | POA: Diagnosis not present

## 2024-11-26 DIAGNOSIS — F411 Generalized anxiety disorder: Secondary | ICD-10-CM

## 2024-11-26 DIAGNOSIS — E785 Hyperlipidemia, unspecified: Secondary | ICD-10-CM | POA: Diagnosis not present

## 2024-11-26 DIAGNOSIS — G4709 Other insomnia: Secondary | ICD-10-CM | POA: Diagnosis not present

## 2024-11-26 MED ORDER — BUSPIRONE HCL 15 MG PO TABS: 15.0000 mg | ORAL_TABLET | Freq: Three times a day (TID) | ORAL | 2 refills | Status: AC

## 2024-11-26 MED ORDER — TIRZEPATIDE 5 MG/0.5ML ~~LOC~~ SOAJ: 5.0000 mg | SUBCUTANEOUS | 5 refills | Status: AC

## 2024-11-26 MED ORDER — ZALEPLON 10 MG PO CAPS: 10.0000 mg | ORAL_CAPSULE | Freq: Every evening | ORAL | 2 refills | Status: AC | PRN

## 2024-11-26 NOTE — Progress Notes (Signed)
 [facility] [address] [place], [place] [address]  Internal MEDICINE  Office Visit Note  Patient Name: Tiffany Burnett  [id number]  [id number]  Date of Service: 11/26/2024  Chief Complaint  Patient presents with   Diabetes   Follow-up    HPI Tiffany Burnett presents for a follow-up visit for diabetes, insomnia, anxiety and high cholesterol.  Insomnia -- taking 10 mg of zaleplon  at bedtime which is wokring well.  Diabetes -- last A1c was normal in December 2025. She is due for urine ACR. She is currently taking mounjaro  5 mg weekly Anxiety -- taking buspirone  and escitalopram  which is effective.  High cholesterol -- taking simvastatin  40 mg daily.   Current Medication: Outpatient Encounter Medications as of 11/26/2024  Medication Sig   Blood Glucose Monitoring Suppl (ACCU-CHEK GUIDE ME) w/Device KIT Use as directed. E11.65   busPIRone  (BUSPAR ) 15 MG tablet Take 1 tablet (15 mg total) by mouth 3 (three) times daily.   escitalopram  (LEXAPRO ) 20 MG tablet Take 1 tablet (20 mg total) by mouth daily.   propranolol (INDERAL) 20 MG tablet Take 20 mg by mouth 2 (two) times daily as needed.   simvastatin  (ZOCOR ) 40 MG tablet Take 1 tablet (40 mg total) by mouth at bedtime.   tirzepatide  (MOUNJARO ) 5 MG/0.5ML Pen Inject 5 mg into the skin once a week.   zaleplon  (SONATA ) 10 MG capsule Take 1 capsule (10 mg total) by mouth at bedtime as needed for sleep.   [DISCONTINUED] Accu-Chek Softclix Lancets lancets Use 1 lancet to check glucose once daily and as needed for diabetes (Patient not taking: Reported on 11/26/2024)   [DISCONTINUED] busPIRone  (BUSPAR ) 15 MG tablet TAKE 1 TABLET BY MOUTH 3 TIMES DAILY.   [DISCONTINUED] glucose blood (ACCU-CHEK GUIDE TEST) test strip Use 1 test strip 3 times daily as needed to check glucose for diabetes. (Patient not taking: Reported on 11/26/2024)   [DISCONTINUED] montelukast  (SINGULAIR ) 10 MG tablet Take 1 tablet (10 mg total) by mouth at bedtime. (Patient not  taking: Reported on 11/26/2024)   [DISCONTINUED] tirzepatide  (MOUNJARO ) 5 MG/0.5ML Pen Inject 5 mg into the skin once a week.   [DISCONTINUED] zaleplon  (SONATA ) 10 MG capsule Take 1 capsule (10 mg total) by mouth at bedtime as needed for sleep.   No facility-administered encounter medications on file as of 11/26/2024.    Surgical History: Past Surgical History:  Procedure Laterality Date   ABDOMINAL HYSTERECTOMY     COLONOSCOPY N/A 04/20/2024   Procedure: COLONOSCOPY;  Surgeon: Jinny Carmine, MD;  Location: [facility] ENDOSCOPY;  Service: Endoscopy;  Laterality: N/A;   POLYPECTOMY  04/20/2024   Procedure: POLYPECTOMY, INTESTINE;  Surgeon: Jinny Carmine, MD;  Location: [facility] ENDOSCOPY;  Service: Endoscopy;;    Medical History: Past Medical History:  Diagnosis Date   Diabetes mellitus without complication (HCC)     Family History: Family History  Problem Relation Age of Onset   Breast cancer Mother 11   COPD Mother    Dementia Father     Social History   Socioeconomic History   Marital status: Single    Spouse name: Not on file   Number of children: Not on file   Years of education: Not on file   Highest education level: Not on file  Occupational History   Not on file  Tobacco Use   Smoking status: Former    Types: Cigarettes   Smokeless tobacco: Never  Substance and Sexual Activity   Alcohol use: Not Currently   Drug use: Never  Sexual activity: Not Currently  Other Topics Concern   Not on file  Social History Narrative   Not on file   Social Drivers of Health   Tobacco Use: Medium Risk (11/26/2024)   Patient History    Smoking Tobacco Use: Former    Smokeless Tobacco Use: Never    Passive Exposure: Not on file  Financial Resource Strain: Low Risk  (08/05/2023)   Received from [facility]   Overall Financial Resource Strain (CARDIA)    Difficulty of Paying Living Expenses: Not hard at all  Food Insecurity: No Food Insecurity (08/05/2023)   Received  from [facility]   Epic    Within the past 12 months, you worried that your food would run out before you got the money to buy more.: Never true    Within the past 12 months, the food you bought just didn't last and you didn't have money to get more.: Never true  Transportation Needs: No Transportation Needs (08/05/2023)   Received [facility]    In the past 12 months, has lack of transportation kept you from medical appointments or from getting medications?: No    Lack of Transportation (Non-Medical): No  Physical Activity: Not on file  Stress: Not on file  Social Connections: Not on file  Intimate Partner Violence: Not on file  Depression (PHQ2-9): Low Risk (02/14/2024)   Depression (PHQ2-9)    PHQ-2 Score: 0  Alcohol Screen: Not on file  Housing: Low Risk  (08/05/2023)   Received from [facility]   Epic    In the last 12 months, was there a time when you were not able to pay the mortgage or rent on time?: No    In the past 12 months, how many times have you moved where you were living?: 0    At any time in the past 12 months, were you homeless or living in a shelter (including now)?: No  Utilities: Not At Risk (08/05/2023)   Received from [facility] Utilities    Threatened with loss of utilities: No  Health Literacy: Not on file      Review of Systems  Constitutional:  Positive for unexpected weight change. Negative for chills and fatigue.  HENT:  Positive for postnasal drip. Negative for congestion, rhinorrhea, sneezing and sore throat.   Eyes:  Negative for redness.  Respiratory: Negative.  Negative for cough, chest tightness, shortness of breath and wheezing.   Cardiovascular: Negative.  Negative for chest pain and palpitations.  Gastrointestinal:  Positive for nausea. Negative for abdominal pain, constipation, diarrhea and vomiting.  Genitourinary:  Negative for  dysuria and frequency.  Musculoskeletal:  Negative for arthralgias, back pain, joint swelling and neck pain.  Skin:  Negative for rash.  Neurological: Negative.  Negative for tremors and numbness.  Hematological:  Negative for adenopathy. Does not bruise/bleed easily.  Psychiatric/Behavioral:  Positive for sleep disturbance. Negative for behavioral problems (Depression), self-injury and suicidal ideas. The patient is nervous/anxious.     Vital Signs: BP 130/80   Pulse 61   Temp (!) 97.1 F (36.2 C)   Resp 16   Ht 5' 6 (1.676 m)   Wt 136 lb 6.4 oz (61.9 kg)   SpO2 99%   BMI 22.02 kg/m    Physical Exam Vitals reviewed.  Constitutional:      General: She is not in acute distress.  Appearance: Normal appearance. She is obese. She is not ill-appearing.  HENT:     Head: Normocephalic and atraumatic.  Eyes:     Pupils: Pupils are equal, round, and reactive to light.  Cardiovascular:     Rate and Rhythm: Normal rate and regular rhythm.  Pulmonary:     Effort: Pulmonary effort is normal. No respiratory distress.  Neurological:     Mental Status: She is alert and oriented to person, place, and time.  Psychiatric:        Mood and Affect: Mood normal.        Behavior: Behavior normal.        Assessment/Plan: 1. Type 2 diabetes mellitus with other specified complication, without long-term current use of insulin (HCC) (Primary) Urine sent to lab for ACR. Continue mounjaro  as prescribed.  - tirzepatide  (MOUNJARO ) 5 MG/0.5ML Pen; Inject 5 mg into the skin once a week.  Dispense: 2 mL; Refill: 5 - Urine Microalbumin w/creat. ratio  2. Hyperlipidemia associated with type 2 diabetes mellitus (HCC) Continue simvastatin  as prescribed.   3. Other insomnia Continue zaleplon  as prescribed. Follow up in 3 months for additional refills.  - zaleplon  (SONATA ) 10 MG capsule; Take 1 capsule (10 mg total) by mouth at bedtime as needed for sleep.  Dispense: 30 capsule; Refill: 2  4.  Generalized anxiety disorder Continue buspirone  as prescribed  - busPIRone  (BUSPAR ) 15 MG tablet; Take 1 tablet (15 mg total) by mouth 3 (three) times daily.  Dispense: 270 tablet; Refill: 2   General Counseling: Zeniah verbalizes understanding of the findings of todays visit and agrees with plan of treatment. I have discussed any further diagnostic evaluation that may be needed or ordered today. We also reviewed her medications today. she has been encouraged to call the office with any questions or concerns that should arise related to todays visit.    No orders of the defined types were placed in this encounter.   Meds ordered this encounter  Medications   zaleplon  (SONATA ) 10 MG capsule    Sig: Take 1 capsule (10 mg total) by mouth at bedtime as needed for sleep.    Dispense:  30 capsule    Refill:  2    For future refills   busPIRone  (BUSPAR ) 15 MG tablet    Sig: Take 1 tablet (15 mg total) by mouth 3 (three) times daily.    Dispense:  270 tablet    Refill:  2   tirzepatide  (MOUNJARO ) 5 MG/0.5ML Pen    Sig: Inject 5 mg into the skin once a week.    Dispense:  2 mL    Refill:  5    Dx code E11.65    Return in about 3 months (around 02/17/2025) for CPE, Clarissia Mckeen PCP and zaleplon  refills. .   Total time spent:30 Minutes Time spent includes review of chart, medications, test results, and follow up plan with the patient.   [place] Controlled Substance Database was reviewed by me.  This patient was seen by Mardy Maxin, FNP-C in collaboration with Dr. Sigrid Bathe as a part of collaborative care agreement.   Azeneth Carbonell R. Maxin, MSN, FNP-C Internal medicine

## 2024-11-27 ENCOUNTER — Encounter: Payer: Self-pay | Admitting: Nurse Practitioner

## 2024-11-27 LAB — MICROALBUMIN / CREATININE URINE RATIO
Creatinine, Urine: 254.1 mg/dL
Microalb/Creat Ratio: 6 mg/g{creat} (ref 0–29)
Microalbumin, Urine: 14.2 ug/mL

## 2025-02-15 ENCOUNTER — Encounter: Admitting: Nurse Practitioner
